# Patient Record
Sex: Male | Born: 1975 | Race: White | Hispanic: No | Smoking: Former smoker
Health system: Southern US, Community
[De-identification: ages and names within clinical notes are randomized; demographics above are authoritative.]

## PROBLEM LIST (undated history)

## (undated) DIAGNOSIS — K219 Gastro-esophageal reflux disease without esophagitis: Secondary | ICD-10-CM

---

## 2005-09-23 ENCOUNTER — Emergency Department: Payer: Self-pay | Admitting: Emergency Medicine

## 2006-08-28 ENCOUNTER — Emergency Department: Payer: Self-pay | Admitting: Emergency Medicine

## 2008-01-14 ENCOUNTER — Emergency Department: Payer: Self-pay | Admitting: Emergency Medicine

## 2008-03-31 ENCOUNTER — Emergency Department: Payer: Self-pay | Admitting: Emergency Medicine

## 2008-04-04 ENCOUNTER — Emergency Department: Payer: Self-pay | Admitting: Emergency Medicine

## 2008-09-03 ENCOUNTER — Emergency Department: Payer: Self-pay | Admitting: Emergency Medicine

## 2008-10-04 ENCOUNTER — Emergency Department: Payer: Self-pay | Admitting: Emergency Medicine

## 2009-03-07 ENCOUNTER — Emergency Department: Payer: Self-pay | Admitting: Emergency Medicine

## 2009-06-06 ENCOUNTER — Ambulatory Visit: Payer: Self-pay | Admitting: Family Medicine

## 2009-06-11 IMAGING — CR DG SHOULDER 3+V*R*
1 series · 3 of 3 positions shown · non-contrast
Comparison: none

REASON FOR EXAM: fall; pt in [place]
COMMENTS:

PROCEDURE:     DXR - DXR SHOULDER RIGHT COMPLETE  - October 04, 2008  [DATE]
RESULT:     Images of the right shoulder demonstrate no fracture,
dislocation or radiopaque foreign body.

[Series 1: view not recorded · 0.17mm/px · 3 of 3 slices shown]
[im 1/3]
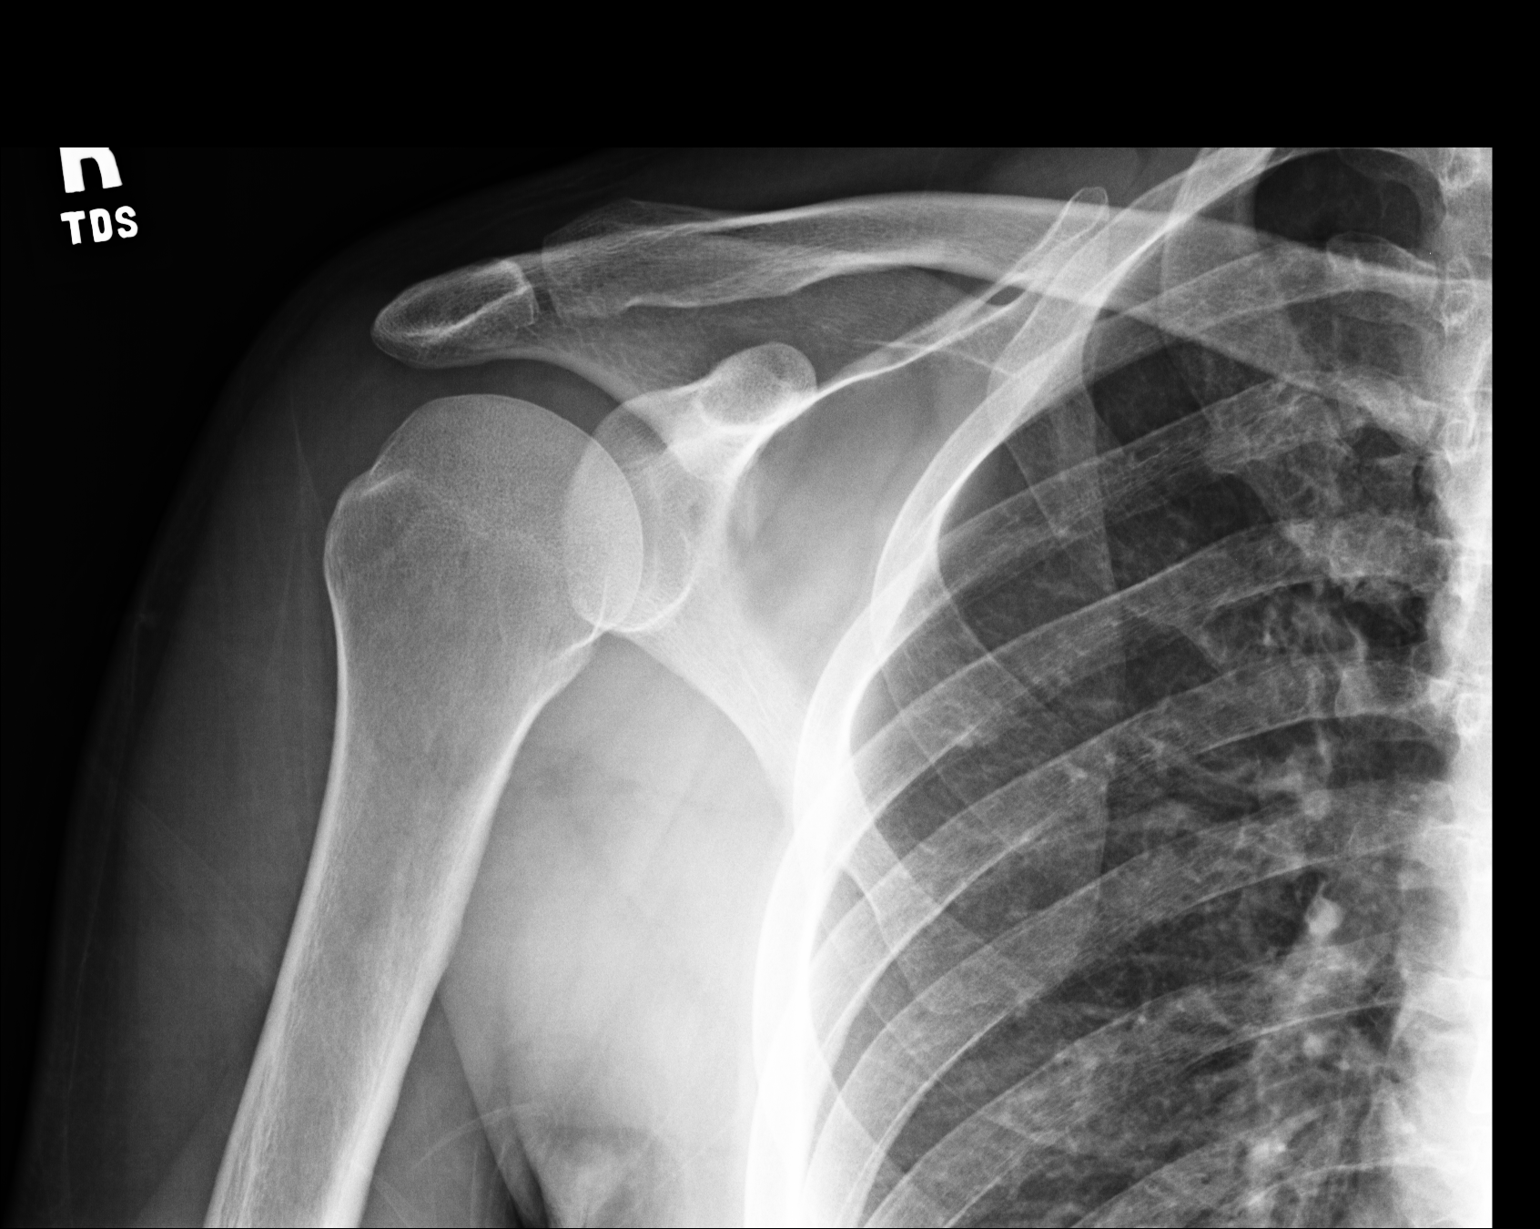
[im 2/3]
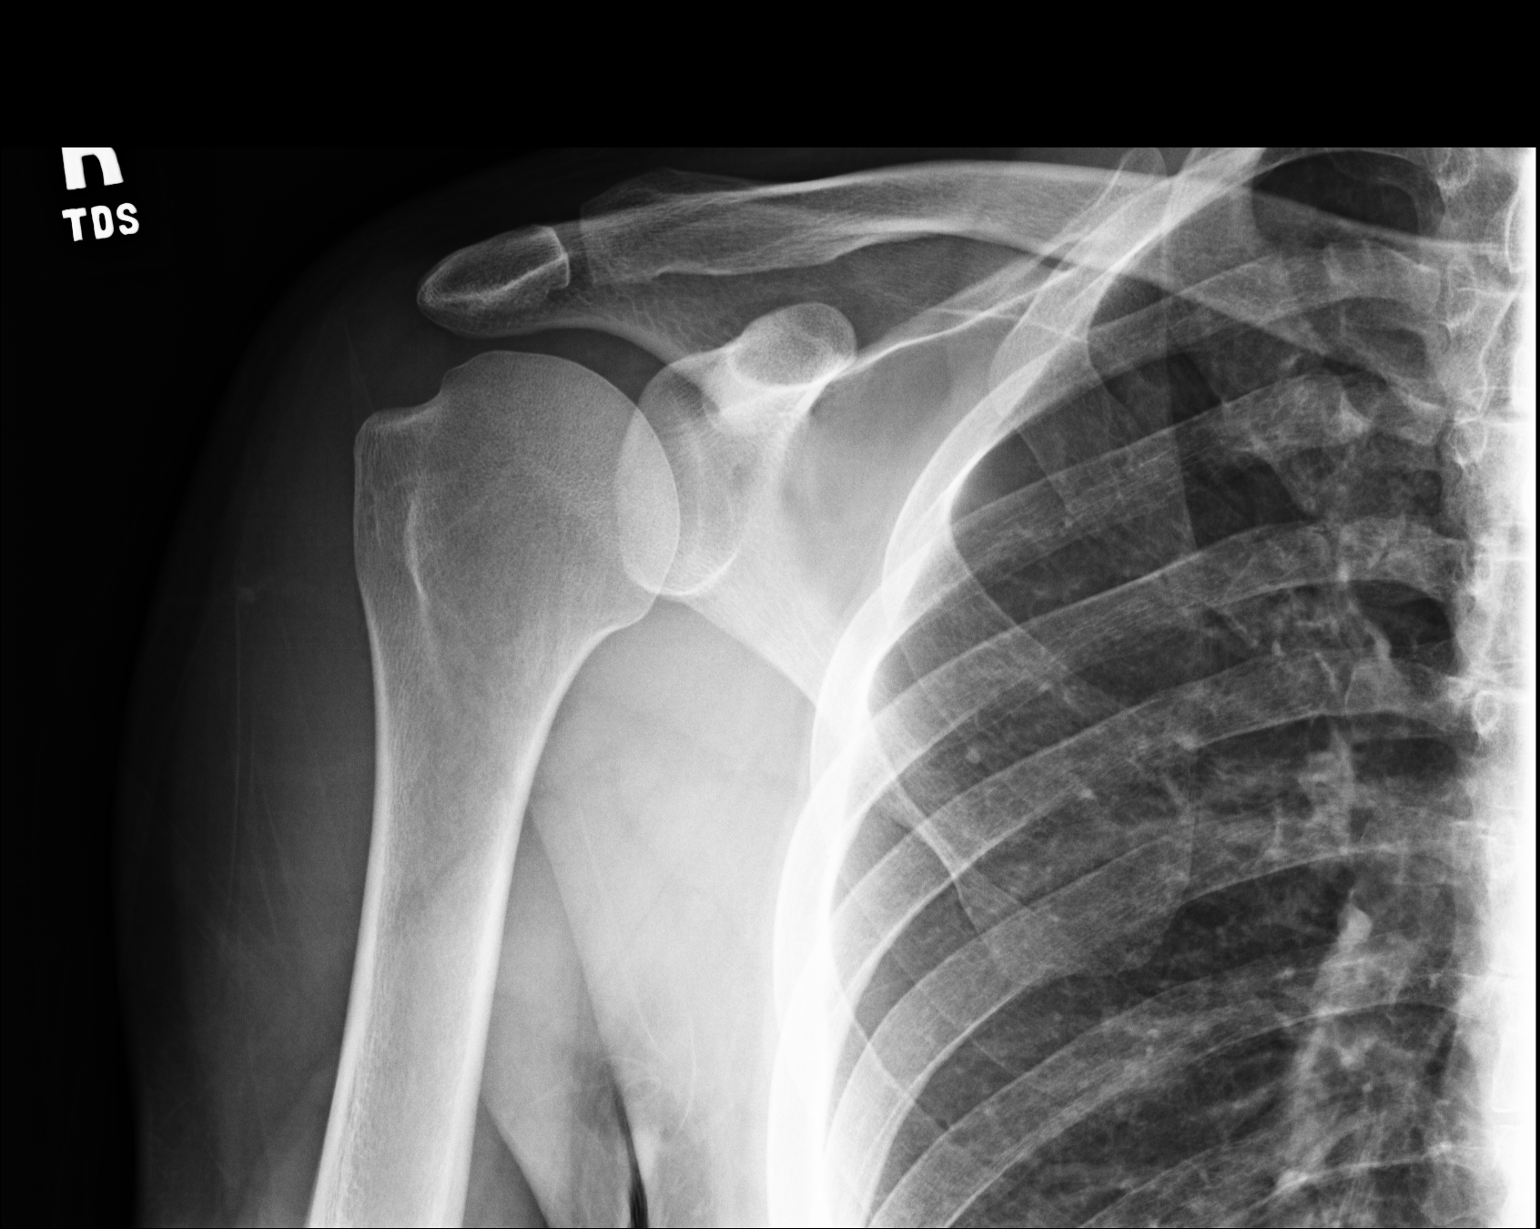
[im 3/3]
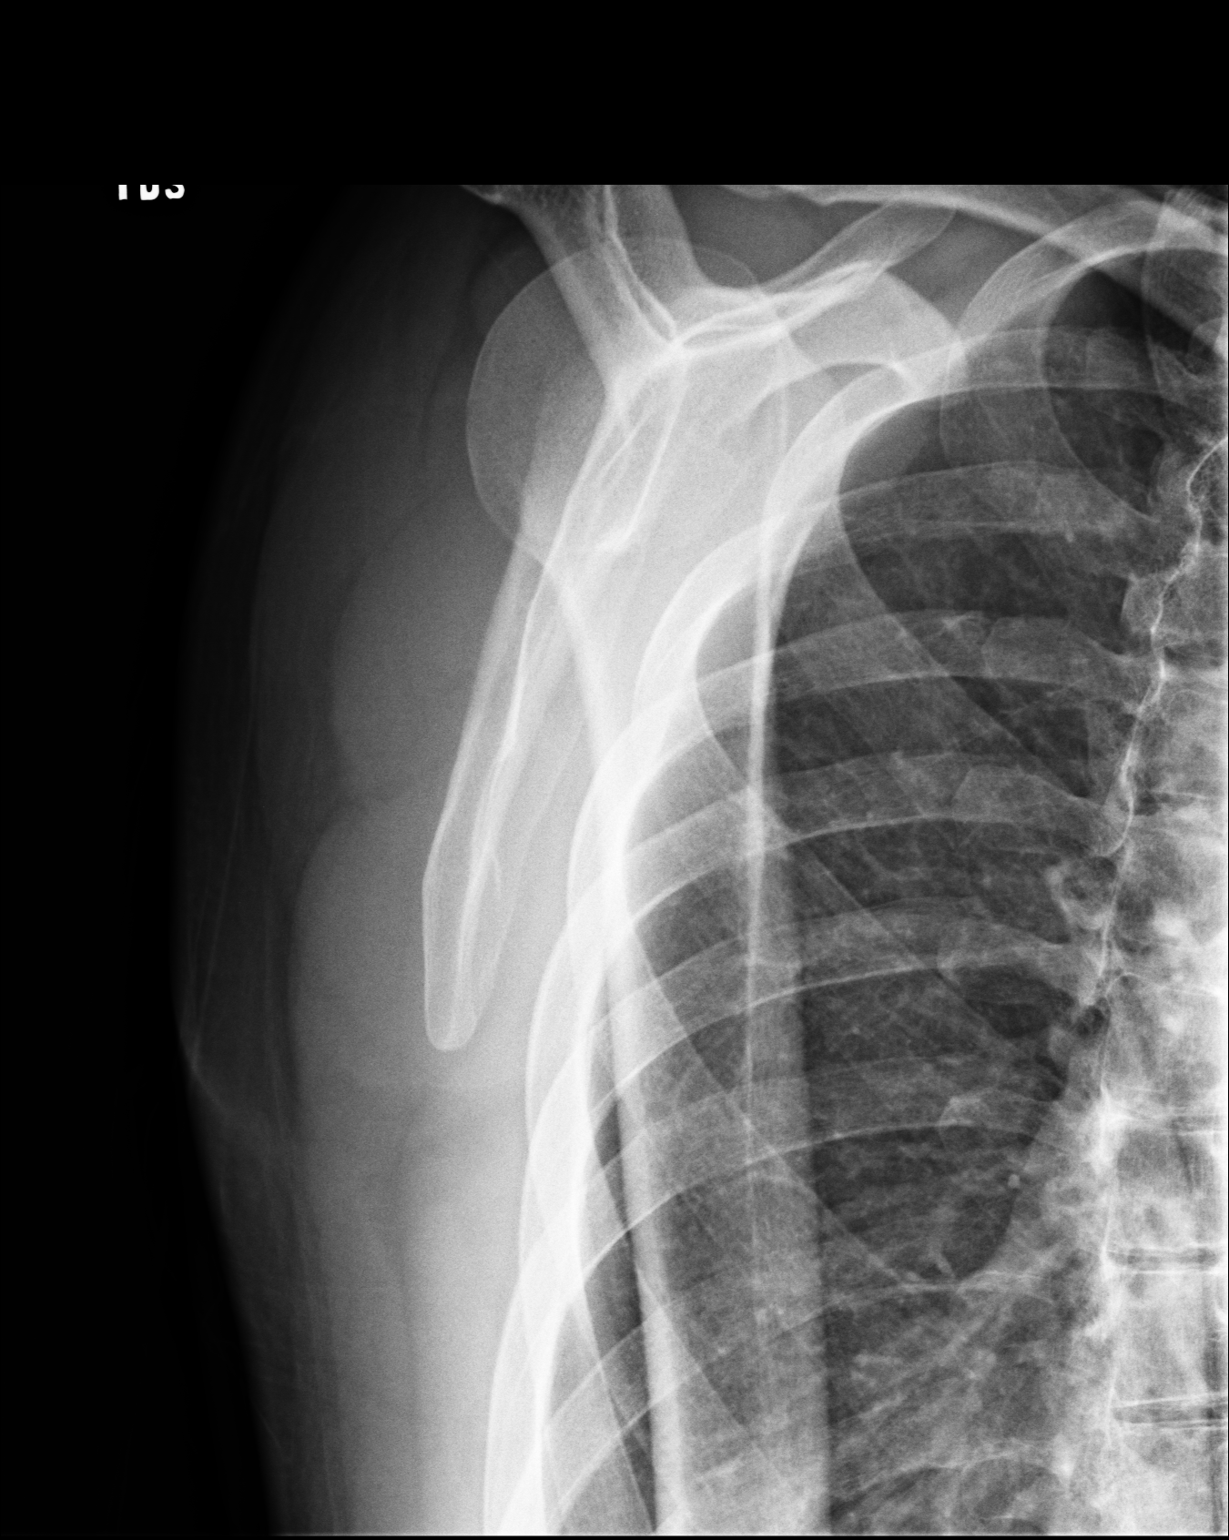

[3 of 3 positions shown; findings below may reference images not displayed]

IMPRESSION: Please see above.

## 2009-06-18 ENCOUNTER — Emergency Department: Payer: Self-pay | Admitting: Emergency Medicine

## 2009-07-27 ENCOUNTER — Ambulatory Visit: Payer: Self-pay | Admitting: Internal Medicine

## 2010-03-12 ENCOUNTER — Ambulatory Visit: Payer: Self-pay | Admitting: Internal Medicine

## 2010-07-10 ENCOUNTER — Ambulatory Visit: Payer: Self-pay | Admitting: Internal Medicine

## 2010-10-04 ENCOUNTER — Emergency Department: Payer: Self-pay | Admitting: Emergency Medicine

## 2013-11-25 ENCOUNTER — Ambulatory Visit: Payer: Self-pay | Admitting: Physician Assistant

## 2014-05-23 ENCOUNTER — Ambulatory Visit: Payer: Self-pay | Admitting: Physician Assistant

## 2014-07-28 ENCOUNTER — Ambulatory Visit: Payer: Self-pay | Admitting: Family Medicine

## 2014-07-28 LAB — URINALYSIS, COMPLETE
Bacteria: NEGATIVE
Bilirubin,UR: NEGATIVE
Blood: NEGATIVE
GLUCOSE, UR: NEGATIVE
Ketone: NEGATIVE
Leukocyte Esterase: NEGATIVE
Nitrite: NEGATIVE
PROTEIN: NEGATIVE
Ph: 7.5 (ref 5.0–8.0)
RBC, UR: NONE SEEN /HPF (ref 0–5)
SPECIFIC GRAVITY: 1.02 (ref 1.000–1.030)
Squamous Epithelial: NONE SEEN

## 2014-11-04 ENCOUNTER — Ambulatory Visit
Admit: 2014-11-04 | Disposition: A | Discharge status: Home / Self Care | Payer: Self-pay | Attending: Specialist | Admitting: Specialist

## 2015-06-28 ENCOUNTER — Ambulatory Visit: Payer: Medicaid Other

## 2015-06-28 ENCOUNTER — Ambulatory Visit
Admission: EM | Admit: 2015-06-28 | Discharge: 2015-06-28 | Disposition: A | Discharge status: Home / Self Care | Payer: Medicaid Other | Attending: Family Medicine | Admitting: Family Medicine

## 2015-06-28 ENCOUNTER — Encounter: Payer: Self-pay | Admitting: Emergency Medicine

## 2015-06-28 DIAGNOSIS — Z87891 Personal history of nicotine dependence: Secondary | ICD-10-CM | POA: Insufficient documentation

## 2015-06-28 DIAGNOSIS — K219 Gastro-esophageal reflux disease without esophagitis: Secondary | ICD-10-CM | POA: Diagnosis not present

## 2015-06-28 DIAGNOSIS — M62830 Muscle spasm of back: Secondary | ICD-10-CM | POA: Diagnosis not present

## 2015-06-28 DIAGNOSIS — Z79899 Other long term (current) drug therapy: Secondary | ICD-10-CM | POA: Insufficient documentation

## 2015-06-28 DIAGNOSIS — M549 Dorsalgia, unspecified: Secondary | ICD-10-CM | POA: Diagnosis present

## 2015-06-28 DIAGNOSIS — Z880 Allergy status to penicillin: Secondary | ICD-10-CM | POA: Insufficient documentation

## 2015-06-28 HISTORY — DX: Gastro-esophageal reflux disease without esophagitis: K21.9

## 2015-06-28 LAB — URINALYSIS COMPLETE WITH MICROSCOPIC (ARMC ONLY)
Bacteria, UA: NONE SEEN
Bilirubin Urine: NEGATIVE
Glucose, UA: NEGATIVE mg/dL
Hgb urine dipstick: NEGATIVE
Ketones, ur: NEGATIVE mg/dL
Leukocytes, UA: NEGATIVE
Nitrite: NEGATIVE
Specific Gravity, Urine: 1.025 (ref 1.005–1.030)
pH: 6.5 (ref 5.0–8.0)

## 2015-06-28 MED ORDER — DIAZEPAM 2 MG PO TABS: 2.0000 mg | ORAL_TABLET | Freq: Three times a day (TID) | ORAL | Status: DC

## 2015-06-28 MED ORDER — NAPROXEN 500 MG PO TABS: 500.0000 mg | ORAL_TABLET | Freq: Two times a day (BID) | ORAL | Status: DC

## 2015-06-28 MED ORDER — METAXALONE 800 MG PO TABS: 800.0000 mg | ORAL_TABLET | Freq: Three times a day (TID) | ORAL | Status: DC

## 2015-06-28 NOTE — ED Provider Notes (Signed)
CSN: [id number]     Arrival date & time 06/28/15  0810 History   First MD Initiated Contact with Patient 06/28/15 [phone]     Chief Complaint  Patient presents with  . Back Pain   (Consider location/radiation/quality/duration/timing/severity/associated sxs/prior Treatment) HPI   A 39 year old male who injured his back approximately 2 days ago. He states that he was walking up a hill to get on his 4 wheeler after digging a ditch and filling it with brick. The ditch was about knee level and depth. He slipped and hit his back against the rim of the ditch. He did not have immediate pain until later that night when he is awakened about 1:00 in the morning with severe low back pain. Since that time he is not able to be comfortable whenever he lies on his back or sits back on his back. Any pressure is painful. We will have some radiation into his buttock on both sides but no radiation beyond that point. He denies any numbness or tingling into his extremities.  Past Medical History  Diagnosis Date  . GERD (gastroesophageal reflux disease)    History reviewed. No pertinent past surgical history. History reviewed. No pertinent family history. Social History  Substance Use Topics  . Smoking status: Former Games developer  . Smokeless tobacco: Current User    Types: Chew  . Alcohol Use: No    Review of Systems  Constitutional: Positive for activity change. Negative for chills and fatigue.  Musculoskeletal: Positive for back pain.  All other systems reviewed and are negative.   Allergies  Penicillins  Home Medications   Prior to Admission medications   Medication Sig Start Date End Date Taking? Authorizing Provider  omeprazole (PRILOSEC) 40 MG capsule Take 40 mg by mouth 2 (two) times daily.   Yes Historical Provider, MD  diazepam (VALIUM) 2 MG tablet Take 1 tablet (2 mg total) by mouth 3 (three) times daily. 06/28/15   Lutricia Feil, PA-C  metaxalone (SKELAXIN) 800 MG tablet Take 1 tablet (800  mg total) by mouth 3 (three) times daily. 06/28/15   Lutricia Feil, PA-C  naproxen (NAPROSYN) 500 MG tablet Take 1 tablet (500 mg total) by mouth 2 (two) times daily. 06/28/15   Lutricia Feil, PA-C   Meds Ordered and Administered this Visit  Medications - No data to display  BP 125/76 mmHg  Pulse 70  Temp(Src) 97 F (36.1 C) (Tympanic)  Resp 16  Ht  (1.905 m)  Wt 270 lb (122.471 kg)  BMI 33.75 kg/m2  SpO2 99% No data found.   Physical Exam  Constitutional: He is oriented to person, place, and time. He appears well-developed and well-nourished. No distress.  HENT:  Head: Normocephalic and atraumatic.  Eyes: Pupils are equal, round, and reactive to light.  Neck: Normal range of motion. Neck supple.  Musculoskeletal: He exhibits tenderness. He exhibits no edema.  Examination lumbar spine shows the patient to maintain a slightly forward flexed positioning. Is half full range of motion of the for flexion. Is no reversal of the lordotic curve. Lateral flexion bilaterally is limited with blunting of the lumbar segments. The patient is able to toe and heel walk adequately. EHL peroneal and anterior tibialis is intact and strong to clinical stressing. Sensation is intact to light touch. DTRs are 2+ over 4 bilaterally symmetrical. Straight leg raise in the sitting position on the left is negative at 90 but on the right produces some back pain only at 90. He  has tenderness to palpation in the paraspinous muscles from L3-S1. There is no specific tenderness to palpation over the spinous processes.  Neurological: He is alert and oriented to person, place, and time. He has normal reflexes.  Skin: Skin is warm and dry. He is not diaphoretic.  Psychiatric: He has a normal mood and affect. His behavior is normal. Judgment and thought content normal.  Nursing note and vitals reviewed.   ED Course  Procedures (including critical care time)  Labs Review Labs Reviewed  URINALYSIS  COMPLETEWITH MICROSCOPIC ([facility] ONLY) - Abnormal; Notable for the following:    Protein, ur TRACE (*)    Squamous Epithelial / LPF 0-5 (*)    All other components within normal limits    Imaging Review Dg Lumbar Spine Complete  06/28/2015  CLINICAL DATA:  Pain following fall 4 days prior EXAM: LUMBAR SPINE - COMPLETE 4+ VIEW COMPARISON:  June 06, 2009 FINDINGS: Frontal, lateral, spot lumbosacral lateral, and bilateral oblique views were obtained. There are 5 non-rib-bearing lumbar type vertebral bodies. There is no fracture or spondylolisthesis. There is no appreciable disc space narrowing. There are multiple small anterior osteophytes. There is no appreciable facet arthropathy. IMPRESSION: No fracture or spondylolisthesis. No appreciable disc space narrowing. Several small anterior osteophytes are noted, a change from prior study. Electronically Signed   By: Bretta BangWilliam  Woodruff III M.D.   On: 06/28/2015 09:33     Visual Acuity Review  Right Eye Distance:   Left Eye Distance:   Bilateral Distance:    Right Eye Near:   Left Eye Near:    Bilateral Near:         MDM   1. Lumbar paraspinal muscle spasm    New Prescriptions   DIAZEPAM (VALIUM) 2 MG TABLET    Take 1 tablet (2 mg total) by mouth 3 (three) times daily.   METAXALONE (SKELAXIN) 800 MG TABLET    Take 1 tablet (800 mg total) by mouth 3 (three) times daily.   NAPROXEN (NAPROSYN) 500 MG TABLET    Take 1 tablet (500 mg total) by mouth 2 (two) times daily.  Plan: 1. Test/x-ray results and diagnosis reviewed with patient 2. rx as per orders; risks, benefits, potential side effects reviewed with patient 3. Recommend supportive treatment with rest and heat or ice as necessary. The patient is unable to read or write so all instructions were explained in detail in the presence of his wife. He has any questions told him to return to our clinic.  4. F/u prn if symptoms worsen or don't improve     Lutricia FeilWilliam P Jaquese Irving,  PA-C 06/28/15 [phone]

## 2015-06-28 NOTE — ED Notes (Signed)
Patient c/o lower back pain after falling in a ditch at his house and landing on his back 2 days ago.

## 2016-03-08 ENCOUNTER — Ambulatory Visit
Admission: EM | Admit: 2016-03-08 | Discharge: 2016-03-08 | Disposition: A | Discharge status: Home / Self Care | Payer: Medicaid Other | Attending: Family Medicine | Admitting: Family Medicine

## 2016-03-08 ENCOUNTER — Encounter: Payer: Self-pay | Admitting: Emergency Medicine

## 2016-03-08 ENCOUNTER — Ambulatory Visit: Payer: Medicaid Other

## 2016-03-08 DIAGNOSIS — S67190A Crushing injury of right index finger, initial encounter: Secondary | ICD-10-CM | POA: Insufficient documentation

## 2016-03-08 DIAGNOSIS — Z880 Allergy status to penicillin: Secondary | ICD-10-CM | POA: Diagnosis not present

## 2016-03-08 DIAGNOSIS — F17200 Nicotine dependence, unspecified, uncomplicated: Secondary | ICD-10-CM | POA: Diagnosis not present

## 2016-03-08 DIAGNOSIS — X58XXXA Exposure to other specified factors, initial encounter: Secondary | ICD-10-CM | POA: Insufficient documentation

## 2016-03-08 DIAGNOSIS — S6991XA Unspecified injury of right wrist, hand and finger(s), initial encounter: Secondary | ICD-10-CM

## 2016-03-08 DIAGNOSIS — S6000XA Contusion of unspecified finger without damage to nail, initial encounter: Secondary | ICD-10-CM

## 2016-03-08 DIAGNOSIS — K219 Gastro-esophageal reflux disease without esophagitis: Secondary | ICD-10-CM | POA: Diagnosis not present

## 2016-03-08 MED ORDER — KETOROLAC TROMETHAMINE 60 MG/2ML IM SOLN
60.0000 mg | Freq: Once | INTRAMUSCULAR | Status: AC
  Administered 2016-03-08: 60 mg via INTRAMUSCULAR

## 2016-03-08 MED ORDER — OXYCODONE-ACETAMINOPHEN 5-325 MG PO TABS: 1.0000 | ORAL_TABLET | Freq: Three times a day (TID) | ORAL | 0 refills | Status: DC | PRN

## 2016-03-08 MED ORDER — MELOXICAM 15 MG PO TABS: 15.0000 mg | ORAL_TABLET | Freq: Every day | ORAL | 0 refills | Status: DC

## 2016-03-08 NOTE — ED Provider Notes (Signed)
[facility]    CSN: [id number] Arrival date & time: 03/08/16  1225  First Provider Contact:  None       History   Chief Complaint Chief Complaint  Patient presents with  . Finger Injury    HPI Cammy BrochureJohn [place]use [place] Bucy is a 40 y.o. male.   Patient is a 40 year old white male who was working on motor vehicle part was trying to loosen the part with a hammer and one up hitting his right pointer finger at the DIP joint. Stasis 10 out 10 pain. Difficulty bending the joint moving the finger. No loss of consciousness. He does chew tobacco as broached penicillin. No pertinent family medical history that relates to this visit..   The history is provided by the patient. No language interpreter was used.  Hand Injury  Location:  Hand Hand location:  R hand Injury: yes   Time since incident:  4 hours Mechanism of injury: crush   Crush injury:    Mechanism:  Hand tool Pain details:    Quality:  Aching and shooting   Radiates to:  Does not radiate   Severity:  Severe   Onset quality:  Sudden   Progression:  Unchanged Handedness:  Right-handed Dislocation: no   Foreign body present:  No foreign bodies Relieved by:  Nothing Ineffective treatments:  None tried Risk factors: no concern for non-accidental trauma, no known bone disorder, no frequent fractures and no recent illness     Past Medical History:  Diagnosis Date  . GERD (gastroesophageal reflux disease)     There are no active problems to display for this patient.   History reviewed. No pertinent surgical history.     Home Medications    Prior to Admission medications   Medication Sig Start Date End Date Taking? Authorizing Provider  meloxicam (MOBIC) 15 MG tablet Take 1 tablet (15 mg total) by mouth daily. 03/08/16   Hassan RowanEugene Shaquila Sigman, MD  omeprazole (PRILOSEC) 40 MG capsule Take 40 mg by mouth 2 (two) times daily.    Historical Provider, MD  oxyCODONE-acetaminophen (PERCOCET/ROXICET) 5-325 MG tablet Take 1  tablet by mouth every 8 (eight) hours as needed for moderate pain or severe pain. 03/08/16   Hassan RowanEugene Jamine Wingate, MD    Family History History reviewed. No pertinent family history.  Social History Social History  Substance Use Topics  . Smoking status: Former Games developermoker  . Smokeless tobacco: Current User    Types: Chew  . Alcohol use No     Allergies   Penicillins   Review of Systems Review of Systems  All other systems reviewed and are negative.    Physical Exam Triage Vital Signs ED Triage Vitals  Enc Vitals Group     BP 03/08/16 1247 115/73     Pulse Rate 03/08/16 1247 82     Resp 03/08/16 1247 16     Temp 03/08/16 1247 97 F (36.1 C)     Temp Source 03/08/16 1247 Tympanic     SpO2 03/08/16 1247 98 %     Weight --      Height --      Head Circumference --      Peak Flow --      Pain Score 03/08/16 1250 10     Pain Loc --      Pain Edu? --      Excl. in GC? --    No data found.   Updated Vital Signs BP 115/73 (BP Location: Left Arm)  Pulse 82   Temp 97 F (36.1 C) (Tympanic)   Resp 16   SpO2 98%   Visual Acuity Right Eye Distance:   Left Eye Distance:   Bilateral Distance:    Right Eye Near:   Left Eye Near:    Bilateral Near:     Physical Exam   UC Treatments / Results  Labs (all labs ordered are listed, but only abnormal results are displayed) Labs Reviewed - No data to display  EKG  EKG Interpretation None       Radiology Dg Finger Index Right  Result Date: 03/08/2016 CLINICAL DATA:  Hit finger with hammer today EXAM: RIGHT INDEX FINGER 2+V COMPARISON:  Right finger films of 03/31/2008 FINDINGS: No acute fracture is seen. Alignment is normal. Joint spaces appear normal. A small faintly opaque foreign body is noted in the soft tissues of the palmar aspect of the right second digit along the radial aspect. IMPRESSION: No acute fracture. Tiny opaque foreign body in the soft tissues of the right second is as noted above. Electronically Signed    By: Dwyane Dee M.D.   On: 03/08/2016 13:03    Procedures Procedures (including critical care time)  Medications Ordered in UC Medications  ketorolac (TORADOL) injection 60 mg (not administered)     Initial Impression / Assessment and Plan / UC Course  I have reviewed the triage vital signs and the nursing notes.  Pertinent labs & imaging results that were available during my care of the patient were reviewed by me and considered in my medical decision making (see chart for details).  Clinical Course    Patient forcing does not have a fracture by x-ray evaluation. He has some swelling at the DIP joint and discomfort in moving the finger and bending the finger. We'll place a finger splint on the right hand place him on Mobic 15 mg 1 tablet day will give him a prescription for some Percocet to use for pain and will give him a shot of Toradol now. Instructed to follow-up PCP in about 1-2 weeks if needed. Continue using curoherapy for now.  Final Clinical Impressions(s) / UC Diagnoses   Final diagnoses:  Finger injury, right, initial encounter  Finger contusion, initial encounter    New Prescriptions New Prescriptions   MELOXICAM (MOBIC) 15 MG TABLET    Take 1 tablet (15 mg total) by mouth daily.   OXYCODONE-ACETAMINOPHEN (PERCOCET/ROXICET) 5-325 MG TABLET    Take 1 tablet by mouth every 8 (eight) hours as needed for moderate pain or severe pain.     Hassan Rowan, MD 03/08/16 [phone]

## 2016-03-08 NOTE — ED Triage Notes (Signed)
Patient states that he hit a hammer on his right 2nd finger today.

## 2016-03-08 NOTE — ED Notes (Signed)
Patient shows no signs of adverse reaction to medication at this time.  

## 2016-07-03 ENCOUNTER — Ambulatory Visit
Admission: EM | Admit: 2016-07-03 | Discharge: 2016-07-03 | Disposition: A | Discharge status: Home / Self Care | Payer: Medicaid Other | Attending: Family Medicine | Admitting: Family Medicine

## 2016-07-03 DIAGNOSIS — R51 Headache: Secondary | ICD-10-CM | POA: Insufficient documentation

## 2016-07-03 DIAGNOSIS — Z79899 Other long term (current) drug therapy: Secondary | ICD-10-CM | POA: Insufficient documentation

## 2016-07-03 DIAGNOSIS — Z87891 Personal history of nicotine dependence: Secondary | ICD-10-CM | POA: Insufficient documentation

## 2016-07-03 DIAGNOSIS — J029 Acute pharyngitis, unspecified: Secondary | ICD-10-CM | POA: Insufficient documentation

## 2016-07-03 DIAGNOSIS — Z880 Allergy status to penicillin: Secondary | ICD-10-CM | POA: Diagnosis not present

## 2016-07-03 DIAGNOSIS — K219 Gastro-esophageal reflux disease without esophagitis: Secondary | ICD-10-CM | POA: Diagnosis not present

## 2016-07-03 LAB — RAPID STREP SCREEN (MED CTR MEBANE ONLY): STREPTOCOCCUS, GROUP A SCREEN (DIRECT): NEGATIVE

## 2016-07-03 MED ORDER — AZITHROMYCIN 250 MG PO TABS: ORAL_TABLET | ORAL | 0 refills | Status: DC

## 2016-07-03 MED ORDER — ACETAMINOPHEN 325 MG PO TABS
650.0000 mg | ORAL_TABLET | Freq: Once | ORAL | Status: AC
  Administered 2016-07-03: 650 mg via ORAL

## 2016-07-03 NOTE — ED Provider Notes (Signed)
CSN: [id number]     Arrival date & time 07/03/16  0804 History   First MD Initiated Contact with Patient 07/03/16 0827     Chief Complaint  Patient presents with  . Sore Throat  . Headache   (Consider location/radiation/quality/duration/timing/severity/associated sxs/prior Treatment) HPI   40 year old male who is illiterate and swallow three-day history of sore throat and headache. He has had no coughing. He denies any fever or chills. He has a history of a penicillin allergy.       Past Medical History:  Diagnosis Date  . GERD (gastroesophageal reflux disease)    History reviewed. No pertinent surgical history. Family History  Problem Relation Age of Onset  . Diabetes Mother    Social History  Substance Use Topics  . Smoking status: Former Games developermoker  . Smokeless tobacco: Current User    Types: Chew  . Alcohol use No    Review of Systems  Constitutional: Positive for activity change. Negative for appetite change, chills, fatigue and fever.  HENT: Positive for congestion, postnasal drip, sinus pain, sinus pressure, sore throat and voice change.   Respiratory: Negative for cough and shortness of breath.   All other systems reviewed and are negative.   Allergies  Penicillins  Home Medications   Prior to Admission medications   Medication Sig Start Date End Date Taking? Authorizing Provider  omeprazole (PRILOSEC) 40 MG capsule Take 40 mg by mouth 2 (two) times daily.   Yes Historical Provider, MD  azithromycin (ZITHROMAX Z-PAK) 250 MG tablet Use as per package instructions 07/03/16   Lutricia FeilWilliam P Jlynn Ly, PA-C   Meds Ordered and Administered this Visit   Medications  acetaminophen (TYLENOL) tablet 650 mg (650 mg Oral Given 07/03/16 0841)    BP 115/76 (BP Location: Right Arm)   Pulse 80   Temp 98.5 F (36.9 C) (Oral)   Resp 18   Ht 6\' 4"  (1.93 m)   Wt 271 lb (122.9 kg)   SpO2 98%   BMI 32.99 kg/m  No data found.   Physical Exam  Constitutional: He is oriented  to person, place, and time. He appears well-developed and well-nourished.  Patient is very unkempt.  HENT:  Head: Normocephalic and atraumatic.  Right Ear: External ear normal.  Left Ear: External ear normal.  Mouth/Throat: No oropharyngeal exudate.  Patient has a  erythematous pharynx There is no exudate present. He does have tenderness to percussion over the frontal and maxillary sinuses.  Eyes: EOM are normal. Pupils are equal, round, and reactive to light. Right eye exhibits no discharge. Left eye exhibits no discharge.  Neck: Normal range of motion. Neck supple.  Pulmonary/Chest: Effort normal and breath sounds normal. No respiratory distress. He has no wheezes. He has no rales.  Musculoskeletal: Normal range of motion.  Lymphadenopathy:    He has no cervical adenopathy.  Neurological: He is alert and oriented to person, place, and time.  Skin: Skin is warm and dry.  Psychiatric: He has a normal mood and affect. His behavior is normal. Judgment and thought content normal.  Nursing note and vitals reviewed.   Urgent Care Course   Clinical Course     Procedures (including critical care time)  Labs Review Labs Reviewed  RAPID STREP SCREEN (NOT AT [facility])  CULTURE, GROUP A STREP [facility])    Imaging Review No results found. Discharge Medication List as of 07/03/2016  8:52 AM    START taking these medications   Details  azithromycin (ZITHROMAX Z-PAK) 250 MG tablet  Use as per package instructions, Normal        Visual Acuity Review  Right Eye Distance:   Left Eye Distance:   Bilateral Distance:    Right Eye Near:   Left Eye Near:    Bilateral Near:         MDM   1. Pharyngitis, unspecified etiology    Discharge Medication List as of 07/03/2016  8:52 AM    START taking these medications   Details  azithromycin (ZITHROMAX Z-PAK) 250 MG tablet Use as per package instructions, Normal       Plan: 1. Test/x-ray results and diagnosis reviewed with patient 2.  rx as per orders; risks, benefits, potential side effects reviewed with patient 3. Recommend supportive treatment with treatment with salt water gargles or lozenges. This is explained to the patient in detail. I also provided him with written material that hopefully he will have someone be able to read to him. I attempted to explain to him the virus  and that he may not improve with the antibiotics. If it is a virus it will have to run its course. I have further recommended that he consider following up with his primary care physician if he is not improving in several days. 4. F/u prn if symptoms worsen or don't improve    Lutricia FeilWilliam P Azaylah Stailey, PA-C 07/03/16 [phone]

## 2016-07-03 NOTE — ED Triage Notes (Signed)
Pt states that he is losing his voice and his throat is sore he has also had a headache and tylenol isnt helping.

## 2016-07-06 ENCOUNTER — Telehealth: Payer: Self-pay | Admitting: Gynecology

## 2016-07-06 LAB — CULTURE, GROUP A STREP (THRC)

## 2016-07-06 NOTE — Telephone Encounter (Signed)
Patient return phone call. Negative strep result was given to patient. Per patient throat still hurt and his still taking medication that was given. Advice patient to follow up if not getting better.

## 2016-10-06 ENCOUNTER — Encounter: Payer: Self-pay | Admitting: *Deleted

## 2016-10-06 ENCOUNTER — Ambulatory Visit
Admission: EM | Admit: 2016-10-06 | Discharge: 2016-10-06 | Disposition: A | Discharge status: Home / Self Care | Payer: Medicaid Other | Attending: Family Medicine | Admitting: Family Medicine

## 2016-10-06 DIAGNOSIS — S39012A Strain of muscle, fascia and tendon of lower back, initial encounter: Secondary | ICD-10-CM

## 2016-10-06 DIAGNOSIS — M5431 Sciatica, right side: Secondary | ICD-10-CM | POA: Diagnosis not present

## 2016-10-06 MED ORDER — PREDNISONE 20 MG PO TABS: 20.0000 mg | ORAL_TABLET | Freq: Every day | ORAL | 0 refills | Status: DC

## 2016-10-06 MED ORDER — HYDROCODONE-ACETAMINOPHEN 5-325 MG PO TABS: ORAL_TABLET | ORAL | 0 refills | Status: DC

## 2016-10-06 MED ORDER — CYCLOBENZAPRINE HCL 10 MG PO TABS: 10.0000 mg | ORAL_TABLET | Freq: Three times a day (TID) | ORAL | 0 refills | Status: DC | PRN

## 2016-10-06 NOTE — Discharge Instructions (Signed)
Heat to affected area °

## 2016-10-06 NOTE — ED Provider Notes (Signed)
[facility]    CSN: [id number] Arrival date & time: 10/06/16  1133     History   Chief Complaint Chief Complaint  Patient presents with  . Back Pain    HPI Donald Cooke is a 41 y.o. male.    Back Pain  Location:  Lumbar spine Quality:  Aching Radiates to:  R posterior upper leg Pain severity:  Moderate Pain is:  Same all the time Onset quality:  Sudden Duration:  3 days Timing:  Constant Progression:  Unchanged Chronicity:  New Context: not emotional stress, not falling, not jumping from heights, not lifting heavy objects, not MCA, not MVA, not occupational injury, not pedestrian accident, not physical stress, not recent illness, not recent injury and not twisting   Relieved by:  Nothing Ineffective treatments:  OTC medications Associated symptoms: no abdominal pain, no abdominal swelling, no bladder incontinence, no bowel incontinence, no chest pain, no dysuria, no fever, no headaches, no leg pain, no numbness, no paresthesias, no pelvic pain, no perianal numbness, no tingling, no weakness and no weight loss   Risk factors: lack of exercise and obesity   Risk factors: no hx of cancer, no recent surgery, no steroid use and no vascular disease     Past Medical History:  Diagnosis Date  . GERD (gastroesophageal reflux disease)     There are no active problems to display for this patient.   History reviewed. No pertinent surgical history.     Home Medications    Prior to Admission medications   Medication Sig Start Date End Date Taking? Authorizing Provider  omeprazole (PRILOSEC) 40 MG capsule Take 40 mg by mouth 2 (two) times daily.   Yes Historical Provider, MD  azithromycin (ZITHROMAX Z-PAK) 250 MG tablet Use as per package instructions 07/03/16   Lutricia FeilWilliam P Roemer, PA-C  cyclobenzaprine (FLEXERIL) 10 MG tablet Take 1 tablet (10 mg total) by mouth 3 (three) times daily as needed for muscle spasms. 10/06/16   Payton Mccallumrlando Mishaal Lansdale, MD    HYDROcodone-acetaminophen (NORCO/VICODIN) 5-325 MG tablet 1-2 tabs po bid prn severe pain 10/06/16   Payton Mccallumrlando Khristian Phillippi, MD  predniSONE (DELTASONE) 20 MG tablet Take 1 tablet (20 mg total) by mouth daily. 10/06/16   Payton Mccallumrlando Nature Kueker, MD    Family History Family History  Problem Relation Age of Onset  . Diabetes Mother     Social History Social History  Substance Use Topics  . Smoking status: Former Games developermoker  . Smokeless tobacco: Current User    Types: Chew  . Alcohol use No     Allergies   Penicillins   Review of Systems Review of Systems  Constitutional: Negative for fever and weight loss.  Cardiovascular: Negative for chest pain.  Gastrointestinal: Negative for abdominal pain and bowel incontinence.  Genitourinary: Negative for bladder incontinence, dysuria and pelvic pain.  Musculoskeletal: Positive for back pain.  Neurological: Negative for tingling, weakness, numbness, headaches and paresthesias.     Physical Exam Triage Vital Signs ED Triage Vitals  Enc Vitals Group     BP 10/06/16 1155 126/78     Pulse Rate 10/06/16 1155 80     Resp 10/06/16 1155 18     Temp 10/06/16 1155 98.2 F (36.8 C)     Temp Source 10/06/16 1155 Oral     SpO2 10/06/16 1155 98 %     Weight 10/06/16 1156 280 lb (127 kg)     Height 10/06/16 1156 6\' 3"  (1.905 m)     Head Circumference --  Peak Flow --      Pain Score 10/06/16 1158 10     Pain Loc --      Pain Edu? --      Excl. in GC? --    No data found.   Updated Vital Signs BP 126/78 (BP Location: Right Arm)   Pulse 80   Temp 98.2 F (36.8 C) (Oral)   Resp 18   Ht 6\' 3"  (1.905 m)   Wt 280 lb (127 kg)   SpO2 98%   BMI 35.00 kg/m   Visual Acuity Right Eye Distance:   Left Eye Distance:   Bilateral Distance:    Right Eye Near:   Left Eye Near:    Bilateral Near:     Physical Exam  Constitutional: He appears well-developed and well-nourished. No distress.  Neck: Normal range of motion. Neck supple. No tracheal  deviation present.  Pulmonary/Chest: Effort normal. No stridor. No respiratory distress.  Musculoskeletal:       Cervical back: He exhibits normal range of motion, no tenderness, no bony tenderness, no swelling, no edema, no deformity, no laceration, no pain, no spasm and normal pulse.       Lumbar back: He exhibits tenderness (paraspinous muscles) and spasm. He exhibits normal range of motion, no bony tenderness, no swelling, no edema, no deformity, no laceration, no pain and normal pulse.  Neurological: He is alert. He has normal reflexes. He displays normal reflexes. He exhibits normal muscle tone. Coordination normal.  Skin: No rash noted. He is not diaphoretic.  Nursing note and vitals reviewed.    UC Treatments / Results  Labs (all labs ordered are listed, but only abnormal results are displayed) Labs Reviewed - No data to display  EKG  EKG Interpretation None       Radiology No results found.  Procedures Procedures (including critical care time)  Medications Ordered in UC Medications - No data to display   Initial Impression / Assessment and Plan / UC Course  I have reviewed the triage vital signs and the nursing notes.  Pertinent labs & imaging results that were available during my care of the patient were reviewed by me and considered in my medical decision making (see chart for details).       Final Clinical Impressions(s) / UC Diagnoses   Final diagnoses:  Strain of lumbar region, initial encounter  Sciatica of right side    New Prescriptions Discharge Medication List as of 10/06/2016  1:37 PM    START taking these medications   Details  cyclobenzaprine (FLEXERIL) 10 MG tablet Take 1 tablet (10 mg total) by mouth 3 (three) times daily as needed for muscle spasms., Starting Sat 10/06/2016, Normal    HYDROcodone-acetaminophen (NORCO/VICODIN) 5-325 MG tablet 1-2 tabs po bid prn severe pain, Print    predniSONE (DELTASONE) 20 MG tablet Take 1 tablet (20  mg total) by mouth daily., Starting Sat 10/06/2016, Normal       1. diagnosis reviewed with patient 2. rx as per orders above; reviewed possible side effects, interactions, risks and benefits  3. Recommend supportive treatment with heat, stretching 4. Follow-up prn if symptoms worsen or don't improve   Payton Mccallum, MD 10/06/16 1418

## 2016-10-06 NOTE — ED Triage Notes (Signed)
Patient started having symptom of lower back pain radiating legs bilateral for 4 days.

## 2016-12-22 ENCOUNTER — Encounter: Payer: Self-pay | Admitting: Emergency Medicine

## 2016-12-22 ENCOUNTER — Ambulatory Visit
Admission: EM | Admit: 2016-12-22 | Discharge: 2016-12-22 | Disposition: A | Discharge status: Home / Self Care | Payer: Medicaid Other | Attending: Family Medicine | Admitting: Family Medicine

## 2016-12-22 DIAGNOSIS — S39012A Strain of muscle, fascia and tendon of lower back, initial encounter: Secondary | ICD-10-CM

## 2016-12-22 DIAGNOSIS — M5431 Sciatica, right side: Secondary | ICD-10-CM

## 2016-12-22 MED ORDER — PREDNISONE 20 MG PO TABS: 20.0000 mg | ORAL_TABLET | Freq: Every day | ORAL | 0 refills | Status: DC

## 2016-12-22 MED ORDER — HYDROCODONE-ACETAMINOPHEN 5-325 MG PO TABS: ORAL_TABLET | ORAL | 0 refills | Status: DC

## 2016-12-22 MED ORDER — CYCLOBENZAPRINE HCL 10 MG PO TABS: 10.0000 mg | ORAL_TABLET | Freq: Three times a day (TID) | ORAL | 0 refills | Status: DC | PRN

## 2016-12-22 NOTE — ED Provider Notes (Signed)
[facility]    CSN: [id number] Arrival date & time: 12/22/16  1532     History   Chief Complaint Chief Complaint  Patient presents with  . Back Pain    HPI Donald Cooke is a 41 y.o. male.    Back Pain  Location:  Lumbar spine Quality:  Aching Radiates to:  R posterior upper leg and R knee Pain severity:  Moderate Pain is:  Same all the time Onset quality:  Sudden Duration:  2 days Timing:  Constant Progression:  Unchanged Chronicity:  New Context: lifting heavy objects   Relieved by:  Nothing Ineffective treatments:  OTC medications Associated symptoms: no abdominal pain, no abdominal swelling, no bladder incontinence, no bowel incontinence, no chest pain, no dysuria, no fever, no headaches, no leg pain, no numbness, no paresthesias, no pelvic pain, no perianal numbness, no tingling, no weakness and no weight loss   Risk factors: no hx of cancer, no hx of osteoporosis, no lack of exercise, no menopause, not obese, not pregnant, no recent surgery, no steroid use and no vascular disease     Past Medical History:  Diagnosis Date  . GERD (gastroesophageal reflux disease)     There are no active problems to display for this patient.   History reviewed. No pertinent surgical history.     Home Medications    Prior to Admission medications   Medication Sig Start Date End Date Taking? Authorizing Provider  pantoprazole (PROTONIX) 40 MG tablet Take 40 mg by mouth 2 (two) times daily.   Yes [provider]  cyclobenzaprine (FLEXERIL) 10 MG tablet Take 1 tablet (10 mg total) by mouth 3 (three) times daily as needed for muscle spasms. 12/22/16   Payton Mccallumonty, Doss Cybulski, MD  HYDROcodone-acetaminophen (NORCO/VICODIN) 5-325 MG tablet 1 tab po qd prn 12/22/16   Payton Mccallumonty, Liese Dizdarevic, MD  predniSONE (DELTASONE) 20 MG tablet Take 1 tablet (20 mg total) by mouth daily. 12/22/16   Payton Mccallumonty, Viriginia Amendola, MD    Family History Family History  Problem Relation Age of Onset  .  Diabetes Mother     Social History Social History  Substance Use Topics  . Smoking status: Former Games developermoker  . Smokeless tobacco: Current User    Types: Chew  . Alcohol use No     Allergies   Penicillins   Review of Systems Review of Systems  Constitutional: Negative for fever and weight loss.  Cardiovascular: Negative for chest pain.  Gastrointestinal: Negative for abdominal pain and bowel incontinence.  Genitourinary: Negative for bladder incontinence, dysuria and pelvic pain.  Musculoskeletal: Positive for back pain.  Neurological: Negative for tingling, weakness, numbness, headaches and paresthesias.     Physical Exam Triage Vital Signs ED Triage Vitals  Enc Vitals Group     BP 12/22/16 1554 115/73     Pulse Rate 12/22/16 1554 74     Resp 12/22/16 1554 16     Temp 12/22/16 1554 98.8 F (37.1 C)     Temp Source 12/22/16 1554 Oral     SpO2 12/22/16 1554 98 %     Weight 12/22/16 1552 275 lb (124.7 kg)     Height 12/22/16 1552 6\' 4"  (1.93 m)     Head Circumference --      Peak Flow --      Pain Score 12/22/16 1552 8     Pain Loc --      Pain Edu? --      Excl. in GC? --    No  data found.   Updated Vital Signs BP 115/73 (BP Location: Left Arm)   Pulse 74   Temp 98.8 F (37.1 C) (Oral)   Resp 16   Ht 6\' 4"  (1.93 m)   Wt 275 lb (124.7 kg)   SpO2 98%   BMI 33.47 kg/m   Visual Acuity Right Eye Distance:   Left Eye Distance:   Bilateral Distance:    Right Eye Near:   Left Eye Near:    Bilateral Near:     Physical Exam  Constitutional: He appears well-developed and well-nourished. No distress.  Neck: Normal range of motion. Neck supple. No tracheal deviation present.  Pulmonary/Chest: Effort normal. No stridor. No respiratory distress.  Musculoskeletal:       Cervical back: Normal. He exhibits normal range of motion, no tenderness, no bony tenderness, no swelling, no edema, no deformity, no laceration, no pain, no spasm and normal pulse.        Lumbar back: He exhibits tenderness and spasm. He exhibits normal range of motion, no bony tenderness, no swelling, no edema, no deformity, no laceration, no pain and normal pulse.  Neurological: He is alert. He has normal reflexes. He displays normal reflexes. He exhibits normal muscle tone. Coordination normal.  Skin: No rash noted. He is not diaphoretic.  Nursing note and vitals reviewed.    UC Treatments / Results  Labs (all labs ordered are listed, but only abnormal results are displayed) Labs Reviewed - No data to display  EKG  EKG Interpretation None       Radiology No results found.  Procedures Procedures (including critical care time)  Medications Ordered in UC Medications - No data to display   Initial Impression / Assessment and Plan / UC Course  I have reviewed the triage vital signs and the nursing notes.  Pertinent labs & imaging results that were available during my care of the patient were reviewed by me and considered in my medical decision making (see chart for details).       Final Clinical Impressions(s) / UC Diagnoses   Final diagnoses:  Strain of lumbar region, initial encounter  Sciatica of right side    New Prescriptions Discharge Medication List as of 12/22/2016  4:27 PM    START taking these medications   Details  cyclobenzaprine (FLEXERIL) 10 MG tablet Take 1 tablet (10 mg total) by mouth 3 (three) times daily as needed for muscle spasms., Starting Sat 12/22/2016, Normal    HYDROcodone-acetaminophen (NORCO/VICODIN) 5-325 MG tablet 1 tab po qd prn, Print    predniSONE (DELTASONE) 20 MG tablet Take 1 tablet (20 mg total) by mouth daily., Starting Sat 12/22/2016, Normal       1. diagnosis reviewed with patient 2. rx as per orders above; reviewed possible side effects, interactions, risks and benefits  3. Recommend supportive treatment with heat, stretching 4. Follow-up prn if symptoms worsen or don't improve   Payton Mccallum,  MD 12/22/16 1643

## 2016-12-22 NOTE — ED Triage Notes (Signed)
Patient c/o lower back pain that started yesterday.   Patient denies injury. 

## 2018-03-12 ENCOUNTER — Encounter: Payer: Self-pay | Admitting: *Deleted

## 2018-03-12 ENCOUNTER — Emergency Department
Admission: EM | Admit: 2018-03-12 | Discharge: 2018-03-13 | Disposition: A | Discharge status: Home / Self Care | Payer: Medicaid Other | Attending: Emergency Medicine | Admitting: Emergency Medicine

## 2018-03-12 ENCOUNTER — Emergency Department: Payer: Medicaid Other

## 2018-03-12 ENCOUNTER — Other Ambulatory Visit: Payer: Self-pay

## 2018-03-12 DIAGNOSIS — L02414 Cutaneous abscess of left upper limb: Secondary | ICD-10-CM | POA: Diagnosis not present

## 2018-03-12 DIAGNOSIS — R2232 Localized swelling, mass and lump, left upper limb: Secondary | ICD-10-CM | POA: Diagnosis present

## 2018-03-12 DIAGNOSIS — L0390 Cellulitis, unspecified: Secondary | ICD-10-CM

## 2018-03-12 DIAGNOSIS — Z87891 Personal history of nicotine dependence: Secondary | ICD-10-CM | POA: Insufficient documentation

## 2018-03-12 LAB — CBC WITH DIFFERENTIAL/PLATELET
Basophils Absolute: 0 10*3/uL (ref 0–0.1)
Basophils Relative: 0 %
EOS ABS: 0.1 10*3/uL (ref 0–0.7)
Eosinophils Relative: 1 %
HEMATOCRIT: 37.8 % — AB (ref 40.0–52.0)
HEMOGLOBIN: 13.2 g/dL (ref 13.0–18.0)
LYMPHS PCT: 23 %
Lymphs Abs: 2.9 10*3/uL (ref 1.0–3.6)
MCH: 30.4 pg (ref 26.0–34.0)
MCHC: 34.9 g/dL (ref 32.0–36.0)
MCV: 87.2 fL (ref 80.0–100.0)
Monocytes Absolute: 0.8 10*3/uL (ref 0.2–1.0)
Monocytes Relative: 6 %
NEUTROS ABS: 8.6 10*3/uL — AB (ref 1.4–6.5)
NEUTROS PCT: 70 %
Platelets: 298 10*3/uL (ref 150–440)
RBC: 4.33 MIL/uL — AB (ref 4.40–5.90)
RDW: 14.2 % (ref 11.5–14.5)
WBC: 12.3 10*3/uL — AB (ref 3.8–10.6)

## 2018-03-12 LAB — COMPREHENSIVE METABOLIC PANEL
ALT: 19 U/L (ref 0–44)
ANION GAP: 9 (ref 5–15)
AST: 16 U/L (ref 15–41)
Albumin: 4.1 g/dL (ref 3.5–5.0)
Alkaline Phosphatase: 74 U/L (ref 38–126)
BUN: 9 mg/dL (ref 6–20)
CHLORIDE: 105 mmol/L (ref 98–111)
CO2: 26 mmol/L (ref 22–32)
CREATININE: 0.83 mg/dL (ref 0.61–1.24)
Calcium: 9.1 mg/dL (ref 8.9–10.3)
Glucose, Bld: 128 mg/dL — ABNORMAL HIGH (ref 70–99)
POTASSIUM: 3.8 mmol/L (ref 3.5–5.1)
SODIUM: 140 mmol/L (ref 135–145)
Total Bilirubin: 0.7 mg/dL (ref 0.3–1.2)
Total Protein: 8.5 g/dL — ABNORMAL HIGH (ref 6.5–8.1)

## 2018-03-12 MED ORDER — LIDOCAINE-EPINEPHRINE (PF) 2 %-1:200000 IJ SOLN
10.0000 mL | Freq: Once | INTRAMUSCULAR | Status: DC
  Filled 2018-03-12: qty 10

## 2018-03-12 MED ORDER — CLINDAMYCIN PHOSPHATE 900 MG/50ML IV SOLN
900.0000 mg | Freq: Once | INTRAVENOUS | Status: AC
  Administered 2018-03-12: 900 mg via INTRAVENOUS
  Filled 2018-03-12: qty 50

## 2018-03-12 MED ORDER — ACETAMINOPHEN 500 MG PO TABS
1000.0000 mg | ORAL_TABLET | Freq: Once | ORAL | Status: AC
  Administered 2018-03-12: 1000 mg via ORAL
  Filled 2018-03-12: qty 2

## 2018-03-12 MED ORDER — SODIUM CHLORIDE 0.9 % IV BOLUS
1000.0000 mL | Freq: Once | INTRAVENOUS | Status: AC
  Administered 2018-03-12: 1000 mL via INTRAVENOUS

## 2018-03-12 MED ORDER — MORPHINE SULFATE (PF) 2 MG/ML IV SOLN
2.0000 mg | Freq: Once | INTRAVENOUS | Status: AC
  Administered 2018-03-12: 2 mg via INTRAVENOUS
  Filled 2018-03-12: qty 1

## 2018-03-12 NOTE — ED Notes (Signed)
FIRST NURSE NOTE:  Pt states he was bit by insect on the left posterior arm, swelling noted to arm.

## 2018-03-12 NOTE — ED Triage Notes (Signed)
Pt has large abscess on lateral LFA. Pt states sxs started yesterday at 1330. Pt c/o increased pain, swelling, and redness.

## 2018-03-12 NOTE — ED Provider Notes (Signed)
[facility] Emergency Department Provider Note  ____________________________________________  Time seen: Approximately 8:55 PM  I have reviewed the triage vital signs and the nursing notes.   HISTORY  Chief Complaint Abscess    HPI Donald Cooke is a 42 y.o. male who presents the emergency department complaining of abscess to the left forearm.  Patient presents stating that he was bitten by something, an unknown insect yesterday.  Patient did not visualize same.  Patient reports that he had pain, redness to the area starting last night.  Patient shaved the area last night.  Today, area has been extending past shaved area has becoming more painful, more erythematous.  Patient reports that he "pinched" in the area and had some minimal bloody/purulent drainage from the site.  No history of recurrent skin lesions.  No medications for this complaint prior to arrival.  Patient denies any headache, neck pain, chest pain, shortness of breath, abdominal pain, nausea vomiting, fevers or chills.    Past Medical History:  Diagnosis Date  . GERD (gastroesophageal reflux disease)     There are no active problems to display for this patient.   History reviewed. No pertinent surgical history.  Prior to Admission medications   Medication Sig Start Date End Date Taking? Authorizing Provider  cyclobenzaprine (FLEXERIL) 10 MG tablet Take 1 tablet (10 mg total) by mouth 3 (three) times daily as needed for muscle spasms. 12/22/16   Payton Mccallumonty, [place], MD  HYDROcodone-acetaminophen (NORCO/VICODIN) 5-325 MG tablet 1 tab po qd prn 12/22/16   Payton Mccallumonty, [place], MD  pantoprazole (PROTONIX) 40 MG tablet Take 40 mg by mouth 2 (two) times daily.    [provider]  predniSONE (DELTASONE) 20 MG tablet Take 1 tablet (20 mg total) by mouth daily. 12/22/16   Payton Mccallumonty, [place], MD    Allergies Penicillins  Family History  Problem Relation Age of Onset  . Diabetes Mother     Social  History Social History   Tobacco Use  . Smoking status: Former Games developermoker  . Smokeless tobacco: Current User    Types: Chew  Substance Use Topics  . Alcohol use: No  . Drug use: No     Review of Systems  Constitutional: No fever/chills Eyes: No visual changes. No discharge ENT: No upper respiratory complaints. Cardiovascular: no chest pain. Respiratory: no cough. No SOB. Gastrointestinal: No abdominal pain.  No nausea, no vomiting.   Musculoskeletal: Negative for musculoskeletal pain. Skin: Positive for erythematous and edematous skin lesion to the left forearm Neurological: Negative for headaches, focal weakness or numbness. 10-point ROS otherwise negative.  ____________________________________________   PHYSICAL EXAM:  VITAL SIGNS: ED Triage Vitals  Enc Vitals Group     BP 03/12/18 1943 122/77     Pulse Rate 03/12/18 1943 93     Resp 03/12/18 1943 18     Temp 03/12/18 1943 100.1 F (37.8 C)     Temp Source 03/12/18 1943 Oral     SpO2 03/12/18 1943 96 %     Weight 03/12/18 1943 275 lb (124.7 kg)     Height 03/12/18 1943 6\' 4"  (1.93 m)     Head Circumference --      Peak Flow --      Pain Score 03/12/18 1959 8     Pain Loc --      Pain Edu? --      Excl. in GC? --      Constitutional: Alert and oriented. Well appearing and in no acute distress. Eyes:  Conjunctivae are normal. PERRL. EOMI. Head: Atraumatic. Neck: No stridor.    Cardiovascular: Normal rate, regular rhythm. Normal S1 and S2.  Good peripheral circulation. Respiratory: Normal respiratory effort without tachypnea or retractions. Lungs CTAB. Good air entry to the bases with no decreased or absent breath sounds. Musculoskeletal: Full range of motion to all extremities. No gross deformities appreciated. Neurologic:  Normal speech and language. No gross focal neurologic deficits are appreciated.  Skin:  Skin is warm, dry and intact. No rash noted.  Patient with significant erythema and edema noted to the  left forearm.  This measures approximately 15 cm x 10 cm.  Largest diameter runs proximal to distal.  Area is extremely firm to palpation over the center of the lesion, with fluctuance surrounding firm center.  No purulent drainage.  Area is marked with indelible marker.  Radial pulse intact distally.  Sensation intact distally. Psychiatric: Mood and affect are normal. Speech and behavior are normal. Patient exhibits appropriate insight and judgement.   ____________________________________________   LABS (all labs ordered are listed, but only abnormal results are displayed)  Labs Reviewed  COMPREHENSIVE METABOLIC PANEL - Abnormal; Notable for the following components:      Result Value   Glucose, Bld 128 (*)    Total Protein 8.5 (*)    All other components within normal limits  CBC WITH DIFFERENTIAL/PLATELET - Abnormal; Notable for the following components:   WBC 12.3 (*)    RBC 4.33 (*)    HCT 37.8 (*)    Neutro Abs 8.6 (*)    All other components within normal limits   ____________________________________________  EKG   ____________________________________________  RADIOLOGY I personally viewed and evaluated these images as part of my medical decision making, as well as reviewing the written report by the radiologist.  [place] Lt Upper Extrem Ltd Soft Tissue Non Vascular  Result Date: 03/12/2018 CLINICAL DATA:  Worsening cellulitis EXAM: ULTRASOUND left UPPER EXTREMITY LIMITED TECHNIQUE: Ultrasound examination of the upper extremity soft tissues was performed in the area of clinical concern. COMPARISON:  None FINDINGS: Targeted sonography of the region of concern was performed. This is designated as the left posterior forearm. Significant subcutaneous edema. 3 x 0.8 x 0.6 cm heterogenous collection within the superficial soft tissues that appears to be associated with a sinus tract to the skin. IMPRESSION: 3 cm heterogenous collection within the soft tissues of the posterior forearm  which appears contiguous with a sinus tract to the skin, suspect for infected fluid collection. Significant edema within the surrounding soft tissues consistent with a cellulitis. Electronically Signed   By: Jasmine PangKim  Fujinaga M.D.   On: 03/12/2018 22:59    ____________________________________________    PROCEDURES  Procedure(s) performed:    Marland Kitchen.Marland Kitchen.Incision and Drainage Date/Time: 03/13/2018 12:07 AM Performed by: Evon SlackGaines, Thomas C, PA-C Authorized by: Racheal Patchesuthriell, Jonathan D, PA-C   Consent:    Consent obtained:  Verbal   Consent given by:  Patient   Risks discussed:  Bleeding, incomplete drainage and pain Location:    Type:  Abscess   Size:  3cm Pre-procedure details:    Skin preparation:  Betadine Anesthesia (see MAR for exact dosages):    Anesthesia method:  Local infiltration   Local anesthetic:  Lidocaine 1% w/o epi Procedure type:    Complexity:  Simple Procedure details:    Needle aspiration: no     Incision types:  Single straight   Incision depth:  Subcutaneous   Scalpel blade:  11   Wound management:  Probed  and deloculated   Drainage:  Purulent and bloody   Drainage amount:  Scant   Packing materials:  1/4 in gauze Post-procedure details:    Patient tolerance of procedure:  Tolerated well, no immediate complications      Medications  lidocaine-EPINEPHrine (XYLOCAINE W/EPI) 2 %-1:200000 (PF) injection 10 mL (has no administration in time range)  lidocaine-EPINEPHrine (XYLOCAINE W/EPI) 1 %-1:100000 (with pres) injection (has no administration in time range)  sodium chloride 0.9 % bolus 1,000 mL (1,000 mLs Intravenous New Bag/Given 03/12/18 2234)  clindamycin (CLEOCIN) IVPB 900 mg (900 mg Intravenous New Bag/Given 03/12/18 2314)  acetaminophen (TYLENOL) tablet 1,000 mg (1,000 mg Oral Given 03/12/18 2314)  morphine 2 MG/ML injection 2 mg (2 mg Intravenous Given 03/12/18 2335)     ____________________________________________   INITIAL IMPRESSION / ASSESSMENT AND PLAN  / ED COURSE  Pertinent labs & imaging results that were available during my care of the patient were reviewed by me and considered in my medical decision making (see chart for details).  Review of the [place] CSRS was performed in accordance of the [facility] prior to dispensing any controlled drugs.      Patient's diagnosis is consistent with abscess to left forearm.  Patient presents the emergency department complaining of erythema, edema, pain to the left forearm that is drastically worsening.  Significant cellulitis is appreciated.  Ultrasound reveals 3 cm complex fluid collection consistent with abscess.  Incision and drainage in the emergency department revealed scant bloody and purulent drainage from site.  Patient tolerated well.  Patient will be placed on clindamycin at home with limited Norco.  Patient was given 900 mg clindamycin IV here in the emergency department..  Patient will follow-up with primary care as needed, this department 2 days for wound check.  Patient is given ED precautions to return to the ED for any worsening or new symptoms.     ____________________________________________  FINAL CLINICAL IMPRESSION(S) / ED DIAGNOSES  Final diagnoses:  Cellulitis  Abscess of forearm, left      NEW MEDICATIONS STARTED DURING THIS VISIT:  ED Discharge Orders    None          This chart was dictated using voice recognition software/Dragon. Despite best efforts to proofread, errors can occur which can change the meaning. Any change was purely unintentional.    Racheal Patches, PA-C 03/13/18 0009    Willy Eddy, MD 03/13/18 [id number]

## 2018-03-13 MED ORDER — CLINDAMYCIN HCL 300 MG PO CAPS: 300.0000 mg | ORAL_CAPSULE | Freq: Four times a day (QID) | ORAL | 0 refills | Status: AC

## 2018-03-13 MED ORDER — LIDOCAINE-EPINEPHRINE 1 %-1:100000 IJ SOLN
INTRAMUSCULAR | Status: AC
  Filled 2018-03-13: qty 1

## 2018-03-13 MED ORDER — HYDROCODONE-ACETAMINOPHEN 5-325 MG PO TABS: 1.0000 | ORAL_TABLET | Freq: Four times a day (QID) | ORAL | 0 refills | Status: DC | PRN

## 2018-03-13 NOTE — ED Provider Notes (Signed)
[facility] EMERGENCY DEPARTMENT Provider Note   CSN: [id number] Arrival date & time: 03/12/18  [id number]     History   Chief Complaint Chief Complaint  Patient presents with  . Abscess    HPI Donald Cooke is a 42 y.o. male.  Presents to the emergency department for evaluation of left forearm abscess.  Abscess began yesterday.  He thinks he may have a bit by something.  Denies any trauma or injury.  Noticed a small bump yesterday with increasing redness and swelling.  Patient shaved area of erythema and noted today that area of erythema had increased in size with more swelling.  He has noted mild low-grade fevers and chills.  He denies any elbow pain, wrist pain, shoulder pain.  Pain is moderate.  Is not any medications for pain or for fevers.  HPI  Past Medical History:  Diagnosis Date  . GERD (gastroesophageal reflux disease)     There are no active problems to display for this patient.   History reviewed. No pertinent surgical history.      Home Medications    Prior to Admission medications   Medication Sig Start Date End Date Taking? Authorizing Provider  clindamycin (CLEOCIN) 300 MG capsule Take 1 capsule (300 mg total) by mouth 4 (four) times daily for 10 days. 03/13/18 03/23/18  Evon SlackGaines, Thomas C, PA-C  cyclobenzaprine (FLEXERIL) 10 MG tablet Take 1 tablet (10 mg total) by mouth 3 (three) times daily as needed for muscle spasms. 12/22/16   Payton Mccallumonty, [place], MD  HYDROcodone-acetaminophen (NORCO) 5-325 MG tablet Take 1 tablet by mouth every 6 (six) hours as needed for moderate pain. 03/13/18   Evon SlackGaines, Thomas C, PA-C  pantoprazole (PROTONIX) 40 MG tablet Take 40 mg by mouth 2 (two) times daily.    [provider]  predniSONE (DELTASONE) 20 MG tablet Take 1 tablet (20 mg total) by mouth daily. 12/22/16   Payton Mccallumonty, [place], MD    Family History Family History  Problem Relation Age of Onset  . Diabetes Mother     Social History Social History    Tobacco Use  . Smoking status: Former Games developermoker  . Smokeless tobacco: Current User    Types: Chew  Substance Use Topics  . Alcohol use: No  . Drug use: No     Allergies   Penicillins   Review of Systems Review of Systems  Constitutional: Positive for fever. Negative for chills.  Musculoskeletal: Positive for arthralgias and myalgias. Negative for joint swelling and neck pain.  Skin: Positive for rash and wound.  Neurological: Negative for numbness.     Physical Exam Updated Vital Signs BP 114/71 (BP Location: Right Arm)   Pulse 83   Temp 100.1 F (37.8 C) (Oral)   Resp 19   Ht 6\' 4"  (1.93 m)   Wt 124.7 kg   SpO2 98%   BMI 33.47 kg/m   Physical Exam  Constitutional: He is oriented to person, place, and time. He appears well-developed and well-nourished.  HENT:  Head: Normocephalic and atraumatic.  Eyes: Conjunctivae are normal.  Neck: Normal range of motion.  Cardiovascular: Normal rate.  Pulmonary/Chest: Effort normal. No respiratory distress.  Musculoskeletal: Normal range of motion.  Normal range of motion of the left elbow wrist and digits neurovascular intact.  Indurated area of erythema along the mid dorsal forearm.  No significant fluctuance.  Compartments are soft.  Neurological: He is alert and oriented to person, place, and time.  Skin: Skin is warm. No  rash noted.  Psychiatric: He has a normal mood and affect. His behavior is normal. Thought content normal.     ED Treatments / Results  Labs (all labs ordered are listed, but only abnormal results are displayed) Labs Reviewed  COMPREHENSIVE METABOLIC PANEL - Abnormal; Notable for the following components:      Result Value   Glucose, Bld 128 (*)    Total Protein 8.5 (*)    All other components within normal limits  CBC WITH DIFFERENTIAL/PLATELET - Abnormal; Notable for the following components:   WBC 12.3 (*)    RBC 4.33 (*)    HCT 37.8 (*)    Neutro Abs 8.6 (*)    All other components within  normal limits    EKG None  Radiology [place] Lt Upper Extrem Ltd Soft Tissue Non Vascular  Result Date: 03/12/2018 CLINICAL DATA:  Worsening cellulitis EXAM: ULTRASOUND left UPPER EXTREMITY LIMITED TECHNIQUE: Ultrasound examination of the upper extremity soft tissues was performed in the area of clinical concern. COMPARISON:  None FINDINGS: Targeted sonography of the region of concern was performed. This is designated as the left posterior forearm. Significant subcutaneous edema. 3 x 0.8 x 0.6 cm heterogenous collection within the superficial soft tissues that appears to be associated with a sinus tract to the skin. IMPRESSION: 3 cm heterogenous collection within the soft tissues of the posterior forearm which appears contiguous with a sinus tract to the skin, suspect for infected fluid collection. Significant edema within the surrounding soft tissues consistent with a cellulitis. Electronically Signed   By: Jasmine PangKim  Fujinaga M.D.   On: 03/12/2018 22:59    Procedures .Marland Kitchen.Incision and Drainage Date/Time: 03/13/2018 12:12 AM Performed by: Evon SlackGaines, Thomas C, PA-C Authorized by: Evon SlackGaines, Thomas C, PA-C   Consent:    Consent obtained:  Verbal   Consent given by:  Patient   Risks discussed:  Pain   Alternatives discussed:  No treatment, delayed treatment and observation Location:    Type:  Fluid collection   Size:  3x3 cm   Location:  Upper extremity   Upper extremity location:  Arm   Arm location:  L lower arm Pre-procedure details:    Skin preparation:  Betadine Anesthesia (see MAR for exact dosages):    Anesthesia method:  Local infiltration   Local anesthetic:  Lidocaine 1% WITH epi Procedure type:    Complexity:  Simple Procedure details:    Incision types:  Stab incision   Incision depth:  Dermal   Scalpel blade:  11   Wound management:  Probed and deloculated   Drainage:  Bloody and purulent   Drainage amount:  Scant   Wound treatment:  Wound left open   Packing materials:  1/4 in  gauze Post-procedure details:    Patient tolerance of procedure:  Tolerated well, no immediate complications   (including critical care time)  Medications Ordered in ED Medications  lidocaine-EPINEPHrine (XYLOCAINE W/EPI) 2 %-1:200000 (PF) injection 10 mL (has no administration in time range)  lidocaine-EPINEPHrine (XYLOCAINE W/EPI) 1 %-1:100000 (with pres) injection (has no administration in time range)  sodium chloride 0.9 % bolus 1,000 mL (1,000 mLs Intravenous New Bag/Given 03/12/18 2234)  clindamycin (CLEOCIN) IVPB 900 mg (900 mg Intravenous New Bag/Given 03/12/18 2314)  acetaminophen (TYLENOL) tablet 1,000 mg (1,000 mg Oral Given 03/12/18 2314)  morphine 2 MG/ML injection 2 mg (2 mg Intravenous Given 03/12/18 2335)     Initial Impression / Assessment and Plan / ED Course  I have reviewed the triage vital signs  and the nursing notes.  Pertinent labs & imaging results that were available during my care of the patient were reviewed by me and considered in my medical decision making (see chart for details).    42 year old male with abscess with cellulitis to left forearm.  Patient running a low-grade fever.  Tylenol was given.  CBC showed slight elevation in white count.  Discussed treatment options such as admission with IV antibiotics, incision and drainage.  Patient refused any type of admission due to his wife having surgery tomorrow morning.  Patient agreeable to round of IV antibiotics tonight along with incision and drainage with packing of iodoform with discharge with p.o. antibiotics.  Patient agreeable to return for any increasing pain, swelling, fevers above 101.  He will also return in 2 days for wound check and packing removal.  Final Clinical Impressions(s) / ED Diagnoses   Final diagnoses:  Cellulitis  Abscess of forearm, left    ED Discharge Orders         Ordered    clindamycin (CLEOCIN) 300 MG capsule  4 times daily     03/13/18 0009    HYDROcodone-acetaminophen  (NORCO) 5-325 MG tablet  Every 6 hours PRN     03/13/18 0009           Evon Slack, PA-C 03/13/18 Simeon Craft, MD 03/13/18 1921

## 2018-03-13 NOTE — Discharge Instructions (Addendum)
Please take antibiotics as prescribed.  You may use Tylenol and ibuprofen for mild to moderate pain.  For severe pain take Norco as needed.  Please return to the emergency department or follow-up with your primary care provider in 2 days for wound check and packing removal of the left forearm abscess.  If any fevers above 101, increasing redness, pain, swelling, worsening symptoms or urgent changes in your health please return to the emergency department sooner.

## 2018-05-28 ENCOUNTER — Emergency Department: Payer: Medicaid Other

## 2018-05-28 ENCOUNTER — Other Ambulatory Visit: Payer: Self-pay

## 2018-05-28 ENCOUNTER — Encounter: Payer: Self-pay | Admitting: Emergency Medicine

## 2018-05-28 ENCOUNTER — Emergency Department
Admission: EM | Admit: 2018-05-28 | Discharge: 2018-05-28 | Disposition: A | Discharge status: Home / Self Care | Payer: Medicaid Other | Attending: Emergency Medicine | Admitting: Emergency Medicine

## 2018-05-28 DIAGNOSIS — Z87891 Personal history of nicotine dependence: Secondary | ICD-10-CM | POA: Insufficient documentation

## 2018-05-28 DIAGNOSIS — W208XXA Other cause of strike by thrown, projected or falling object, initial encounter: Secondary | ICD-10-CM | POA: Diagnosis not present

## 2018-05-28 DIAGNOSIS — S99921A Unspecified injury of right foot, initial encounter: Secondary | ICD-10-CM | POA: Diagnosis present

## 2018-05-28 DIAGNOSIS — S9031XA Contusion of right foot, initial encounter: Secondary | ICD-10-CM | POA: Insufficient documentation

## 2018-05-28 DIAGNOSIS — Y9289 Other specified places as the place of occurrence of the external cause: Secondary | ICD-10-CM | POA: Insufficient documentation

## 2018-05-28 DIAGNOSIS — Y9389 Activity, other specified: Secondary | ICD-10-CM | POA: Insufficient documentation

## 2018-05-28 DIAGNOSIS — Z79899 Other long term (current) drug therapy: Secondary | ICD-10-CM | POA: Insufficient documentation

## 2018-05-28 DIAGNOSIS — Y999 Unspecified external cause status: Secondary | ICD-10-CM | POA: Diagnosis not present

## 2018-05-28 MED ORDER — TRAMADOL HCL 50 MG PO TABS: 50.0000 mg | ORAL_TABLET | Freq: Three times a day (TID) | ORAL | 0 refills | Status: AC | PRN

## 2018-05-28 NOTE — ED Triage Notes (Signed)
Pt dropped ramp to moving van on right foot.  Ambulatory to triage with limp. No deformity noted. Good cap refill.

## 2018-05-28 NOTE — ED Provider Notes (Signed)
[facility] Emergency Department Provider Note  ____________________________________________  Time seen: Approximately 11:26 PM  I have reviewed the triage vital signs and the nursing notes.   HISTORY  Chief Complaint Foot Injury    HPI Donald Cooke is a 42 y.o. male presents to the emergency department with acute right foot pain after patient reports that he was moving a metal ramp and ramp fell on foot.  Patient reports pain along the dorsum of the right foot.  Patient had some tingling along the plantar aspect of the right foot after injury occurred.  He has been able to ambulate without difficulty.  He he currently rates his pain at 7 out of 10 in intensity.  No alleviating measures were attempted prior to presenting to the emergency department.   Past Medical History:  Diagnosis Date  . GERD (gastroesophageal reflux disease)     There are no active problems to display for this patient.   History reviewed. No pertinent surgical history.  Prior to Admission medications   Medication Sig Start Date End Date Taking? Authorizing Provider  cyclobenzaprine (FLEXERIL) 10 MG tablet Take 1 tablet (10 mg total) by mouth 3 (three) times daily as needed for muscle spasms. 12/22/16   Payton Mccallum, MD  HYDROcodone-acetaminophen (NORCO) 5-325 MG tablet Take 1 tablet by mouth every 6 (six) hours as needed for moderate pain. 03/13/18   Evon Slack, PA-C  pantoprazole (PROTONIX) 40 MG tablet Take 40 mg by mouth 2 (two) times daily.    [provider]  predniSONE (DELTASONE) 20 MG tablet Take 1 tablet (20 mg total) by mouth daily. 12/22/16   Payton Mccallum, MD  traMADol (ULTRAM) 50 MG tablet Take 1 tablet (50 mg total) by mouth 3 (three) times daily as needed for up to 3 days. 05/28/18 05/31/18  Orvil Feil, PA-C    Allergies Penicillins  Family History  Problem Relation Age of Onset  . Diabetes Mother     Social History Social History    Tobacco Use  . Smoking status: Former Games developer  . Smokeless tobacco: Current User    Types: Chew  Substance Use Topics  . Alcohol use: No  . Drug use: No     Review of Systems  Constitutional: No fever/chills Eyes: No visual changes. No discharge ENT: No upper respiratory complaints. Cardiovascular: no chest pain. Respiratory: no cough. No SOB. Gastrointestinal: No abdominal pain.  No nausea, no vomiting.  No diarrhea.  No constipation. Genitourinary: Negative for dysuria. No hematuria Musculoskeletal: Patient has right foot pain. Skin: Negative for rash, abrasions, lacerations, ecchymosis. Neurological: Negative for headaches, focal weakness or numbness.   ____________________________________________   PHYSICAL EXAM:  VITAL SIGNS: ED Triage Vitals  Enc Vitals Group     BP 05/28/18 2212 135/69     Pulse Rate 05/28/18 2212 95     Resp 05/28/18 2212 18     Temp 05/28/18 2211 99.1 F (37.3 C)     Temp Source 05/28/18 2211 Oral     SpO2 05/28/18 2212 98 %     Weight 05/28/18 2212 295 lb (133.8 kg)     Height 05/28/18 2212 6\' 4"  (1.93 m)     Head Circumference --      Peak Flow --      Pain Score 05/28/18 2212 10     Pain Loc --      Pain Edu? --      Excl. in GC? --  Constitutional: Alert and oriented. Well appearing and in no acute distress. Eyes: Conjunctivae are normal. PERRL. EOMI. Head: Atraumatic. Cardiovascular: Normal rate, regular rhythm. Normal S1 and S2.  Good peripheral circulation. Respiratory: Normal respiratory effort without tachypnea or retractions. Lungs CTAB. Good air entry to the bases with no decreased or absent breath sounds. Musculoskeletal: Patient performs full range of motion at the right ankle.  He has tenderness with palpation over metatarsals 1 through 4.  No pain with palpation over the anterior or posterior talofibular ligaments, right.  No pain over the deltoid ligament, right.  No pain over the course of the right fibula.   Palpable dorsalis pedis pulse, right. Neurologic:  Normal speech and language. No gross focal neurologic deficits are appreciated.  Skin:  Skin is warm, dry and intact. No rash noted. Psychiatric: Mood and affect are normal. Speech and behavior are normal. Patient exhibits appropriate insight and judgement.   ____________________________________________   LABS (all labs ordered are listed, but only abnormal results are displayed)  Labs Reviewed - No data to display ____________________________________________  EKG   ____________________________________________  RADIOLOGY I personally viewed and evaluated these images as part of my medical decision making, as well as reviewing the written report by the radiologist.  Dg Foot Complete Right  Result Date: 05/28/2018 CLINICAL DATA:  Blunt trauma to right foot today. EXAM: RIGHT FOOT COMPLETE - 3+ VIEW COMPARISON:  None. FINDINGS: There is no evidence of fracture or dislocation. There is no evidence of arthropathy or other focal bone abnormality. Soft tissues are unremarkable. IMPRESSION: Negative. Electronically Signed   By: Elberta Fortis M.D.   On: 05/28/2018 22:41    ____________________________________________    PROCEDURES  Procedure(s) performed:    Procedures    Medications - No data to display   ____________________________________________   INITIAL IMPRESSION / ASSESSMENT AND PLAN / ED COURSE  Pertinent labs & imaging results that were available during my care of the patient were reviewed by me and considered in my medical decision making (see chart for details).  Review of the [place] CSRS was performed in accordance of the [facility] prior to dispensing any controlled drugs.      Assessment and plan Right foot contusion Patient presents to the emergency department with right foot pain after dropping a metal ramp on a earlier tonight.  X-ray examination reveals no acute bony abnormality.  Patient was able to ambulate  after incident occurred.  Patient declined crutches in the emergency department.  He was discharged with a short course of tramadol after patient reports that he has history of GI bleeds.  Patient was advised to follow-up with podiatry if conservative measures fail.  All patient questions were answered.     ____________________________________________  FINAL CLINICAL IMPRESSION(S) / ED DIAGNOSES  Final diagnoses:  Contusion of right foot, initial encounter      NEW MEDICATIONS STARTED DURING THIS VISIT:  ED Discharge Orders         Ordered    traMADol (ULTRAM) 50 MG tablet  3 times daily PRN     05/28/18 2313              This chart was dictated using voice recognition software/Dragon. Despite best efforts to proofread, errors can occur which can change the meaning. Any change was purely unintentional.    Orvil Feil, PA-C 05/28/18 2330    Phineas Semen, MD 05/28/18 [phone]

## 2018-06-20 ENCOUNTER — Ambulatory Visit: Payer: Medicaid Other

## 2018-06-20 ENCOUNTER — Other Ambulatory Visit: Payer: Self-pay

## 2018-06-20 ENCOUNTER — Encounter: Payer: Self-pay | Admitting: Emergency Medicine

## 2018-06-20 ENCOUNTER — Ambulatory Visit
Admission: EM | Admit: 2018-06-20 | Discharge: 2018-06-20 | Disposition: A | Discharge status: Home / Self Care | Payer: Medicaid Other | Attending: Family Medicine | Admitting: Family Medicine

## 2018-06-20 DIAGNOSIS — L03012 Cellulitis of left finger: Secondary | ICD-10-CM | POA: Insufficient documentation

## 2018-06-20 DIAGNOSIS — M79645 Pain in left finger(s): Secondary | ICD-10-CM | POA: Diagnosis present

## 2018-06-20 MED ORDER — SULFAMETHOXAZOLE-TRIMETHOPRIM 800-160 MG PO TABS: 1.0000 | ORAL_TABLET | Freq: Two times a day (BID) | ORAL | 0 refills | Status: DC

## 2018-06-20 MED ORDER — HYDROCODONE-ACETAMINOPHEN 5-325 MG PO TABS: ORAL_TABLET | ORAL | 0 refills | Status: DC

## 2018-06-20 NOTE — ED Provider Notes (Signed)
[facility]    CSN: [id number] Arrival date & time: 06/20/18  1645     History   Chief Complaint Chief Complaint  Patient presents with  . Finger Pain    HPI Donald Cooke is a 42 y.o. male.   42 yo male with a c/o left thumb pain, swelling and redness progressively worsening for the past 2-3 days. States a piece of metal cut his thumb slightly about 4 days ago. States he's unsure if there is a piece of metal stuck in his thumb.  Denies any fevers, chills, drainage, numbness. States he's up to date on his tetanus vaccine; states he had it 3 years ago.   The history is provided by the patient.    Past Medical History:  Diagnosis Date  . GERD (gastroesophageal reflux disease)     There are no active problems to display for this patient.   History reviewed. No pertinent surgical history.     Home Medications    Prior to Admission medications   Medication Sig Start Date End Date Taking? Authorizing Provider  pantoprazole (PROTONIX) 40 MG tablet Take 40 mg by mouth 2 (two) times daily.   Yes [provider]  cyclobenzaprine (FLEXERIL) 10 MG tablet Take 1 tablet (10 mg total) by mouth 3 (three) times daily as needed for muscle spasms. 12/22/16   Payton Mccallum, MD  HYDROcodone-acetaminophen [facility]) 5-325 MG tablet 1-2 tabs po qd prn 06/20/18   Payton Mccallum, MD  predniSONE (DELTASONE) 20 MG tablet Take 1 tablet (20 mg total) by mouth daily. 12/22/16   Payton Mccallum, MD  sulfamethoxazole-trimethoprim (BACTRIM DS,SEPTRA DS) 800-160 MG tablet Take 1 tablet by mouth 2 (two) times daily. 06/20/18   Payton Mccallum, MD    Family History Family History  Problem Relation Age of Onset  . Diabetes Mother     Social History Social History   Tobacco Use  . Smoking status: Former Games developer  . Smokeless tobacco: Current User    Types: Chew  Substance Use Topics  . Alcohol use: No  . Drug use: No     Allergies   Penicillins   Review of  Systems Review of Systems   Physical Exam Triage Vital Signs ED Triage Vitals  Enc Vitals Group     BP 06/20/18 1659 129/74     Pulse Rate 06/20/18 1659 88     Resp 06/20/18 1659 16     Temp 06/20/18 1659 98.5 F (36.9 C)     Temp Source 06/20/18 1659 Oral     SpO2 06/20/18 1659 98 %     Weight 06/20/18 1655 285 lb (129.3 kg)     Height 06/20/18 1655 6\' 4"  (1.93 m)     Head Circumference --      Peak Flow --      Pain Score 06/20/18 1655 8     Pain Loc --      Pain Edu? --      Excl. in GC? --    No data found.  Updated Vital Signs BP 129/74 (BP Location: Left Arm)   Pulse 88   Temp 98.5 F (36.9 C) (Oral)   Resp 16   Ht 6\' 4"  (1.93 m)   Wt 129.3 kg   SpO2 98%   BMI 34.69 kg/m   Visual Acuity Right Eye Distance:   Left Eye Distance:   Bilateral Distance:    Right Eye Near:   Left Eye Near:    Bilateral Near:  Physical Exam  Constitutional: He appears well-developed and well-nourished. No distress.  Musculoskeletal:       Left hand: He exhibits tenderness and swelling (over ventral aspect of thumb with blanchable skin erythema, warmth and tenderness). He exhibits normal range of motion, normal two-point discrimination, normal capillary refill, no deformity and no laceration. Normal sensation noted. Normal strength noted.  Skin: He is not diaphoretic.  Nursing note and vitals reviewed.    UC Treatments / Results  Labs (all labs ordered are listed, but only abnormal results are displayed) Labs Reviewed - No data to display  EKG None  Radiology Dg Finger Thumb Left  Result Date: 06/20/2018 CLINICAL DATA:  Pain and swelling in the left thumb.  Laceration. EXAM: LEFT THUMB 2+V COMPARISON:  None. FINDINGS: No opaque foreign body. No soft tissue gas, fracture, or dislocation. Possible soft tissue swelling IMPRESSION: No fracture or opaque foreign body. Electronically Signed   By: Marnee SpringJonathon  Watts M.D.   On: 06/20/2018 17:35    Procedures Procedures  (including critical care time)  Medications Ordered in UC Medications - No data to display  Initial Impression / Assessment and Plan / UC Course  I have reviewed the triage vital signs and the nursing notes.  Pertinent labs & imaging results that were available during my care of the patient were reviewed by me and considered in my medical decision making (see chart for details).      Final Clinical Impressions(s) / UC Diagnoses   Final diagnoses:  Cellulitis of left thumb     Discharge Instructions     Warm wet compresses to thumb, elevate    ED Prescriptions    Medication Sig Dispense Auth. Provider   sulfamethoxazole-trimethoprim (BACTRIM DS,SEPTRA DS) 800-160 MG tablet Take 1 tablet by mouth 2 (two) times daily. 20 tablet Payton Mccallumonty, Bernetha Anschutz, MD   HYDROcodone-acetaminophen (NORCO) 5-325 MG tablet 1-2 tabs po qd prn 4 tablet Payton Mccallumonty, Juley Giovanetti, MD      1. x-ray results and diagnosis reviewed with patient 2. rx as per orders above; reviewed possible side effects, interactions, risks and benefits  3. Recommend supportive treatment as above 4. Go to ED if symptoms worsen or are not improving 5. Follow-up prn  Controlled Substance Prescriptions [place] Controlled Substance Registry consulted? Not Applicable   Payton Mccallumonty, Denzil Mceachron, MD 06/20/18 1758

## 2018-06-20 NOTE — Discharge Instructions (Signed)
Warm wet compresses to thumb, elevate

## 2018-06-20 NOTE — ED Notes (Signed)
Patient states that he got a tetanus shot 3 years ago.  I could not find a record of any tetanus shots.  Patient was informed of this Patient has refused to get a tetanus shot today.

## 2018-06-20 NOTE — ED Triage Notes (Signed)
Patient c/o pain in his left thumb after a piece of metal cut his thumb.  Patient states that this happened on Sunday.

## 2018-06-21 ENCOUNTER — Emergency Department
Admission: EM | Admit: 2018-06-21 | Discharge: 2018-06-21 | Disposition: A | Discharge status: Home / Self Care | Payer: Medicaid Other | Attending: Emergency Medicine | Admitting: Emergency Medicine

## 2018-06-21 ENCOUNTER — Other Ambulatory Visit: Payer: Self-pay

## 2018-06-21 DIAGNOSIS — Z79899 Other long term (current) drug therapy: Secondary | ICD-10-CM | POA: Diagnosis not present

## 2018-06-21 DIAGNOSIS — Z87891 Personal history of nicotine dependence: Secondary | ICD-10-CM | POA: Diagnosis not present

## 2018-06-21 DIAGNOSIS — L03012 Cellulitis of left finger: Secondary | ICD-10-CM | POA: Diagnosis not present

## 2018-06-21 DIAGNOSIS — R2232 Localized swelling, mass and lump, left upper limb: Secondary | ICD-10-CM | POA: Diagnosis present

## 2018-06-21 LAB — CBC
HCT: 41.1 % (ref 39.0–52.0)
HEMOGLOBIN: 13 g/dL (ref 13.0–17.0)
MCH: 28.7 pg (ref 26.0–34.0)
MCHC: 31.6 g/dL (ref 30.0–36.0)
MCV: 90.7 fL (ref 80.0–100.0)
PLATELETS: 298 10*3/uL (ref 150–400)
RBC: 4.53 MIL/uL (ref 4.22–5.81)
RDW: 13.1 % (ref 11.5–15.5)
WBC: 9.9 10*3/uL (ref 4.0–10.5)
nRBC: 0 % (ref 0.0–0.2)

## 2018-06-21 LAB — BASIC METABOLIC PANEL
ANION GAP: 11 (ref 5–15)
BUN: 10 mg/dL (ref 6–20)
CHLORIDE: 102 mmol/L (ref 98–111)
CO2: 28 mmol/L (ref 22–32)
Calcium: 9.2 mg/dL (ref 8.9–10.3)
Creatinine, Ser: 1.07 mg/dL (ref 0.61–1.24)
GFR calc Af Amer: >60 mL/min (ref >60)
Glucose, Bld: 149 mg/dL — ABNORMAL HIGH (ref 70–99)
POTASSIUM: 3.6 mmol/L (ref 3.5–5.1)
SODIUM: 141 mmol/L (ref 135–145)

## 2018-06-21 MED ORDER — ACETAMINOPHEN 500 MG PO TABS
1000.0000 mg | ORAL_TABLET | Freq: Once | ORAL | Status: AC
  Administered 2018-06-21: 1000 mg via ORAL
  Filled 2018-06-21: qty 2

## 2018-06-21 MED ORDER — HYDROCODONE-ACETAMINOPHEN 5-325 MG PO TABS: 1.0000 | ORAL_TABLET | Freq: Four times a day (QID) | ORAL | 0 refills | Status: DC | PRN

## 2018-06-21 MED ORDER — CEPHALEXIN 500 MG PO CAPS: 500.0000 mg | ORAL_CAPSULE | Freq: Four times a day (QID) | ORAL | 0 refills | Status: AC

## 2018-06-21 MED ORDER — LIDOCAINE HCL (PF) 1 % IJ SOLN
2.0000 mL | Freq: Once | INTRAMUSCULAR | Status: DC
Start: 2018-06-21 — End: 2018-06-22
  Filled 2018-06-21: qty 5

## 2018-06-21 MED ORDER — MORPHINE SULFATE (PF) 4 MG/ML IV SOLN
4.0000 mg | Freq: Once | INTRAVENOUS | Status: AC
  Administered 2018-06-21: 4 mg via INTRAVENOUS
  Filled 2018-06-21: qty 1

## 2018-06-21 MED ORDER — VANCOMYCIN HCL 10 G IV SOLR
2000.0000 mg | Freq: Once | INTRAVENOUS | Status: AC
  Administered 2018-06-21: 2000 mg via INTRAVENOUS
  Filled 2018-06-21: qty 2000

## 2018-06-21 MED ORDER — OXYCODONE HCL 5 MG PO TABS
5.0000 mg | ORAL_TABLET | Freq: Once | ORAL | Status: AC
  Administered 2018-06-21: 5 mg via ORAL
  Filled 2018-06-21: qty 1

## 2018-06-21 MED ORDER — CEPHALEXIN 500 MG PO CAPS
500.0000 mg | ORAL_CAPSULE | Freq: Once | ORAL | Status: AC
  Administered 2018-06-21: 500 mg via ORAL
  Filled 2018-06-21: qty 1

## 2018-06-21 MED ORDER — IBUPROFEN 800 MG PO TABS
800.0000 mg | ORAL_TABLET | Freq: Once | ORAL | Status: AC
  Administered 2018-06-21: 800 mg via ORAL
  Filled 2018-06-21: qty 1

## 2018-06-21 NOTE — ED Provider Notes (Signed)
[facility] EMERGENCY DEPARTMENT Provider Note   CSN: [id number] Arrival date & time: 06/21/18  1740     History   Chief Complaint Chief Complaint  Patient presents with  . Hand Problem    HPI Donald Cooke is a 42 y.o. male presents to the emergency department for evaluation of left thumb swelling.  Patient states 6 days ago he suffered a superficial abrasion to the volar aspect of the left thumb along the proximal phalanx with a piece of metal.  Each day he developed increasing pain and swelling and was seen at the urgent care facility 1 day ago on 06/20/2018.  Urgent care facility performed x-rays showing no acute bony abnormality or foreign body.  Tetanus is up-to-date.  He was placed on Bactrim DS.  After 2 doses of Bactrim, patient has not seen much improvement and feels like there is been slight increase in swelling and redness to the thumb.  He comes in today for further evaluation.  Pain is moderate located along the long volar aspect of the proximal phalanx of left thumb.  He is able to fully extend and partially flex the thumb.  Flexion is limited slightly due to amount of swelling.  He denies any numbness or tingling.  Patient is not diabetic.  HPI  Past Medical History:  Diagnosis Date  . GERD (gastroesophageal reflux disease)     There are no active problems to display for this patient.   History reviewed. No pertinent surgical history.      Home Medications    Prior to Admission medications   Medication Sig Start Date End Date Taking? Authorizing Provider  cephALEXin (KEFLEX) 500 MG capsule Take 1 capsule (500 mg total) by mouth 4 (four) times daily for 10 days. 06/21/18 07/01/18  Evon Slack, PA-C  cyclobenzaprine (FLEXERIL) 10 MG tablet Take 1 tablet (10 mg total) by mouth 3 (three) times daily as needed for muscle spasms. 12/22/16   Payton Mccallum, MD  HYDROcodone-acetaminophen (NORCO) 5-325 MG tablet Take 1 tablet by mouth  every 6 (six) hours as needed for moderate pain. 06/21/18   Evon Slack, PA-C  pantoprazole (PROTONIX) 40 MG tablet Take 40 mg by mouth 2 (two) times daily.    [provider]  predniSONE (DELTASONE) 20 MG tablet Take 1 tablet (20 mg total) by mouth daily. 12/22/16   Payton Mccallum, MD  sulfamethoxazole-trimethoprim (BACTRIM DS,SEPTRA DS) 800-160 MG tablet Take 1 tablet by mouth 2 (two) times daily. 06/20/18   Payton Mccallum, MD    Family History Family History  Problem Relation Age of Onset  . Diabetes Mother     Social History Social History   Tobacco Use  . Smoking status: Former Games developer  . Smokeless tobacco: Current User    Types: Chew  Substance Use Topics  . Alcohol use: No  . Drug use: No     Allergies   Penicillins   Review of Systems Review of Systems  Constitutional: Negative for fever.  Respiratory: Negative for shortness of breath.   Cardiovascular: Negative for chest pain.  Gastrointestinal: Negative for nausea and vomiting.  Musculoskeletal: Positive for arthralgias and joint swelling.  Skin: Positive for wound. Negative for rash.  Neurological: Negative for numbness and headaches.     Physical Exam Updated Vital Signs BP 112/63 (BP Location: Left Arm)   Pulse 81   Temp 100.1 F (37.8 C) (Oral)   Resp 20   Ht 6\' 4"  (1.93 m)   Wt  129.2 kg   SpO2 100%   BMI 34.67 kg/m   Physical Exam  Constitutional: He is oriented to person, place, and time. He appears well-developed and well-nourished.  HENT:  Head: Normocephalic and atraumatic.  Eyes: Conjunctivae are normal.  Neck: Normal range of motion.  Cardiovascular: Normal rate.  Pulmonary/Chest: Effort normal. No respiratory distress.  Musculoskeletal:  Examination of the left hand and thumb shows patient has swelling and redness to the volar aspect of the proximal phalanx of the thumb.  There is slight swelling and induration.  Abrasion site is seen with no definite fluctuance.  He is  able to fully extend the thumb as well as flex the thumb 50% of his normal active flexion when compared to the right thumb.  Is nontender along the distal phalanx or thenar eminence.  There is no dorsal erythema.  Neurological: He is alert and oriented to person, place, and time.  Skin: Skin is warm. No rash noted.  Psychiatric: He has a normal mood and affect. His behavior is normal. Thought content normal.     ED Treatments / Results  Labs (all labs ordered are listed, but only abnormal results are displayed) Labs Reviewed  BASIC METABOLIC PANEL - Abnormal; Notable for the following components:      Result Value   Glucose, Bld 149 (*)    All other components within normal limits  AEROBIC/ANAEROBIC CULTURE (SURGICAL/DEEP WOUND)  CBC    EKG None  Radiology Dg Finger Thumb Left  Result Date: 06/20/2018 CLINICAL DATA:  Pain and swelling in the left thumb.  Laceration. EXAM: LEFT THUMB 2+V COMPARISON:  None. FINDINGS: No opaque foreign body. No soft tissue gas, fracture, or dislocation. Possible soft tissue swelling IMPRESSION: No fracture or opaque foreign body. Electronically Signed   By: Marnee Spring M.D.   On: 06/20/2018 17:35    Procedures .Marland KitchenIncision and Drainage Date/Time: 06/21/2018 8:13 PM Performed by: Evon Slack, PA-C Authorized by: Evon Slack, PA-C   Consent:    Consent obtained:  Verbal   Consent given by:  Patient   Alternatives discussed:  No treatment Location:    Type:  Abscess   Size:  1 x 1 cm   Location: Thumb left. Pre-procedure details:    Skin preparation:  Betadine Anesthesia (see MAR for exact dosages):    Anesthesia method:  None Procedure type:    Complexity:  Simple Procedure details:    Incision types:  Stab incision   Incision depth:  Dermal   Scalpel blade:  11   Wound treatment:  Wound left open   Packing materials:  None Post-procedure details:    Patient tolerance of procedure:  Tolerated well, no immediate  complications   (including critical care time)  Medications Ordered in ED Medications  lidocaine (PF) (XYLOCAINE) 1 % injection 2 mL (has no administration in time range)  morphine 4 MG/ML injection 4 mg (4 mg Intravenous Given 06/21/18 1824)  vancomycin (VANCOCIN) 2,000 mg in sodium chloride 0.9 % 500 mL IVPB (0 mg Intravenous Stopped 06/21/18 2302)  cephALEXin (KEFLEX) capsule 500 mg (500 mg Oral Given 06/21/18 2043)  acetaminophen (TYLENOL) tablet 1,000 mg (1,000 mg Oral Given 06/21/18 2108)  oxyCODONE (Oxy IR/ROXICODONE) immediate release tablet 5 mg (5 mg Oral Given 06/21/18 2238)     Initial Impression / Assessment and Plan / ED Course  I have reviewed the triage vital signs and the nursing notes.  Pertinent labs & imaging results that were available during my care  of the patient were reviewed by me and considered in my medical decision making (see chart for details).     [age] year old male with cellulitis to the left thumb.  Patient does not appear to have a infectious tenosynovitis of the left thumb at this time.  Patient is able to flex and fully extend the thumb.  Some concern for possible superficial fluctuant abscess along the volar aspect of proximal phalanx but after stab incision made, no purulent material expressed.  Patient afebrile, no leukocytosis.  Discussed admission to the hospital for observation and administration of IV antibiotics out of concern for possible worsening cellulitis with possible infectious tenosynovitis.  Patient refused any type of hospitalization.  Patient was agreeable to 1 dose of IV antibiotics along with adding cephalexin to Bactrim.  Patient understands signs and symptoms return to ED for.  He will follow-up with 2 days for recheck.  He will return sooner for any worsening symptoms.  Final Clinical Impressions(s) / ED Diagnoses   Final diagnoses:  Cellulitis of left thumb    ED Discharge Orders         Ordered    cephALEXin (KEFLEX) 500 MG  capsule  4 times daily     06/21/18 2202    HYDROcodone-acetaminophen (NORCO) 5-325 MG tablet  Every 6 hours PRN     06/21/18 2202           Ronnette JuniperGaines, Thomas C, PA-C 06/21/18 2303    Jene EveryKinner, Robert, MD 06/21/18 2308

## 2018-06-21 NOTE — ED Triage Notes (Signed)
Pt cut left thumb on piece of metal Sunday. Was prescribed bactrim yesterday at urgent care. States thumb is no better. Redness present, no obvious drainage or odor to site. tetanus shot utd

## 2018-06-21 NOTE — Discharge Instructions (Signed)
Please take Bactrim and cephalexin as prescribed.  Use Norco as needed for moderate to severe pain.  If any fevers, increasing pain, swelling, warmth or redness throughout the thumb return to the emergency department.

## 2018-06-21 NOTE — ED Notes (Signed)
Pt  Lacerated  Base of l  Thumb    6  Days  Seen last night at urgent care  placed  On antibiotics   Pain is worse  Today   Had  X rays

## 2018-08-24 ENCOUNTER — Other Ambulatory Visit: Payer: Self-pay

## 2018-08-24 ENCOUNTER — Emergency Department
Admission: EM | Admit: 2018-08-24 | Discharge: 2018-08-24 | Disposition: A | Discharge status: Home / Self Care | Payer: Medicaid Other | Attending: Emergency Medicine | Admitting: Emergency Medicine

## 2018-08-24 ENCOUNTER — Emergency Department: Payer: Medicaid Other

## 2018-08-24 DIAGNOSIS — Z87891 Personal history of nicotine dependence: Secondary | ICD-10-CM | POA: Diagnosis not present

## 2018-08-24 DIAGNOSIS — H8309 Labyrinthitis, unspecified ear: Secondary | ICD-10-CM | POA: Diagnosis not present

## 2018-08-24 DIAGNOSIS — R42 Dizziness and giddiness: Secondary | ICD-10-CM | POA: Diagnosis present

## 2018-08-24 DIAGNOSIS — Z79899 Other long term (current) drug therapy: Secondary | ICD-10-CM | POA: Diagnosis not present

## 2018-08-24 MED ORDER — PREDNISONE 10 MG (21) PO TBPK: ORAL_TABLET | ORAL | 0 refills | Status: DC

## 2018-08-24 MED ORDER — MECLIZINE HCL 25 MG PO TABS: 25.0000 mg | ORAL_TABLET | Freq: Two times a day (BID) | ORAL | 0 refills | Status: AC | PRN

## 2018-08-24 NOTE — ED Triage Notes (Signed)
Pt to the er for cold symptoms and dizziness. Pt says he has been taking tylenol x 1 week with last dose at 1430 today. Cough heard in triage.

## 2018-08-24 NOTE — ED Notes (Signed)
Patient transported to X-ray 

## 2018-08-24 NOTE — ED Notes (Signed)
Pt reports "deathly afraid" of needles, declining interventions

## 2018-08-24 NOTE — ED Provider Notes (Signed)
[facility] Emergency Department Provider Note  ____________________________________________  Time seen: Approximately 10:04 PM  I have reviewed the triage vital signs and the nursing notes.   HISTORY  Chief Complaint URI    HPI Donald Cooke is a 43 y.o. male presents to the emergency department with dizziness with standing for the past 2 days.  Patient had rhinorrhea, congestion and nonproductive cough for approximately 5 days prior to onset of dizziness.  Patient has had no nausea or vomiting.  He denies headache or changes in vision.  He denies chest pain, chest tightness, shortness of breath or abdominal pain. No alleviating measures have been attempted.    Past Medical History:  Diagnosis Date  . GERD (gastroesophageal reflux disease)     There are no active problems to display for this patient.   History reviewed. No pertinent surgical history.  Prior to Admission medications   Medication Sig Start Date End Date Taking? Authorizing Provider  cyclobenzaprine (FLEXERIL) 10 MG tablet Take 1 tablet (10 mg total) by mouth 3 (three) times daily as needed for muscle spasms. 12/22/16   Payton Mccallum, MD  HYDROcodone-acetaminophen (NORCO) 5-325 MG tablet Take 1 tablet by mouth every 6 (six) hours as needed for moderate pain. 06/21/18   Evon Slack, PA-C  meclizine (ANTIVERT) 25 MG tablet Take 1 tablet (25 mg total) by mouth 2 (two) times daily as needed for up to 7 days for dizziness. 08/24/18 08/31/18  Orvil Feil, PA-C  pantoprazole (PROTONIX) 40 MG tablet Take 40 mg by mouth 2 (two) times daily.    [provider]  predniSONE (STERAPRED UNI-PAK 21 TAB) 10 MG (21) TBPK tablet Take 6 tabs the the 1st day. Take 6 tabs the the 2nd day. Take 5 tabs the the 3rd day. Take 5 tabs the 4th day. Take 4 tabs the the 5th day.Take 4 tabs the the 6th day.Take 3 tabs the 7th day.Take 3 tabs the 8th day. Take 2 tabs the 9th day. Take 2 tabs the 10th day.  Take 1 tab the 11th day. Take 1 tab the 12th day. 08/24/18   Orvil Feil, PA-C  sulfamethoxazole-trimethoprim (BACTRIM DS,SEPTRA DS) 800-160 MG tablet Take 1 tablet by mouth 2 (two) times daily. 06/20/18   Payton Mccallum, MD    Allergies Penicillins  Family History  Problem Relation Age of Onset  . Diabetes Mother     Social History Social History   Tobacco Use  . Smoking status: Former Games developer  . Smokeless tobacco: Current User    Types: Chew  Substance Use Topics  . Alcohol use: No  . Drug use: No     Review of Systems  Constitutional: Patient has dizziness.  Eyes: No visual changes. No discharge ENT: Patient has had nasal congestion. Cardiovascular: no chest pain. Respiratory: Patient has sporadic cough. No SOB. Gastrointestinal: No abdominal pain.  No nausea, no vomiting.  No diarrhea.  No constipation. Genitourinary: Negative for dysuria. No hematuria Musculoskeletal: Negative for musculoskeletal pain. Skin: Negative for rash, abrasions, lacerations, ecchymosis. Neurological: Negative for headaches, focal weakness or numbness.   ____________________________________________   PHYSICAL EXAM:  VITAL SIGNS: ED Triage Vitals  Enc Vitals Group     BP 08/24/18 2037 119/75     Pulse Rate 08/24/18 2037 75     Resp 08/24/18 2037 18     Temp 08/24/18 2037 98.8 F (37.1 C)     Temp Source 08/24/18 2037 Oral     SpO2 08/24/18 2037  100 %     Weight 08/24/18 2038 280 lb (127 kg)     Height 08/24/18 2038 6\' 4"  (1.93 m)     Head Circumference --      Peak Flow --      Pain Score 08/24/18 2038 0     Pain Loc --      Pain Edu? --      Excl. in GC? --      Constitutional: Alert and oriented. Patient is lying supine. Eyes: Conjunctivae are normal. PERRL. EOMI. Head: Atraumatic. ENT:      Ears: Tympanic membranes are mildly injected with mild effusion bilaterally.       Nose: No congestion/rhinnorhea.      Mouth/Throat: Mucous membranes are moist. Posterior  pharynx is mildly erythematous.  Hematological/Lymphatic/Immunilogical: No cervical lymphadenopathy.  Cardiovascular: Normal rate, regular rhythm. Normal S1 and S2.  Good peripheral circulation. Respiratory: Normal respiratory effort without tachypnea or retractions. Lungs CTAB. Good air entry to the bases with no decreased or absent breath sounds. Gastrointestinal: Bowel sounds 4 quadrants. Soft and nontender to palpation. No guarding or rigidity. No palpable masses. No distention. No CVA tenderness. Musculoskeletal: Full range of motion to all extremities. No gross deformities appreciated. Neurologic:  Normal speech and language. No gross focal neurologic deficits are appreciated.  Skin:  Skin is warm, dry and intact. No rash noted. Psychiatric: Mood and affect are normal. Speech and behavior are normal. Patient exhibits appropriate insight and judgement.    ____________________________________________   LABS (all labs ordered are listed, but only abnormal results are displayed)  Labs Reviewed - No data to display ____________________________________________  EKG  Normal sinus rhythm with narrow QRS.  No ST segment elevation or T wave abnormalities.  ____________________________________________  RADIOLOGY I personally viewed and evaluated these images as part of my medical decision making, as well as reviewing the written report by the radiologist.  Dg Chest 2 View  Result Date: 08/24/2018 CLINICAL DATA:  Cough for 1 week EXAM: CHEST - 2 VIEW COMPARISON:  03/08/2009 FINDINGS: Central airways thickening consistent with bronchitis. Streaky atelectasis at the left base. No pleural effusion. Normal heart size. No pneumothorax. IMPRESSION: Central airways thickening which may be secondary to reactive airways or acute bronchitis/viral process. No focal pneumonia. Electronically Signed   By: Jasmine PangKim  Fujinaga M.D.   On: 08/24/2018 21:27     ____________________________________________    PROCEDURES  Procedure(s) performed:    Procedures    Medications - No data to display   ____________________________________________   INITIAL IMPRESSION / ASSESSMENT AND PLAN / ED COURSE  Pertinent labs & imaging results that were available during my care of the patient were reviewed by me and considered in my medical decision making (see chart for details).  Review of the [place] CSRS was performed in accordance of the [facility] prior to dispensing any controlled drugs.      Assessment and Plan: Labyrinthitis:  Patient presents to the emergency department with 2 days of dizziness when standing after having viral URI-like symptoms.  Labyrinthitis is likely at this time.  I ordered basic labs in emergency department.  Patient adamantly declined blood work in the emergency department as he reports fear of needles.  Chest x-ray reveals no acute abnormality.  EKG reveals normal sinus rhythm without ST segment elevation.  Patient was discharged with prednisone and meclizine.  Strict return precautions were given to return to the emergency department for new or worsening symptoms.  All patient questions were answered.  ____________________________________________  FINAL CLINICAL IMPRESSION(S) / ED DIAGNOSES  Final diagnoses:  Labyrinthitis, unspecified laterality      NEW MEDICATIONS STARTED DURING THIS VISIT:  ED Discharge Orders         Ordered    predniSONE (STERAPRED UNI-PAK 21 TAB) 10 MG (21) TBPK tablet     08/24/18 2223    meclizine (ANTIVERT) 25 MG tablet  2 times daily PRN     08/24/18 2223              This chart was dictated using voice recognition software/Dragon. Despite best efforts to proofread, errors can occur which can change the meaning. Any change was purely unintentional.    Gasper Lloyd 08/24/18 2233    Minna Antis, MD 08/24/18 2306

## 2018-08-24 NOTE — ED Provider Notes (Signed)
EKG read interpreted by me shows normal sinus rhythm rate of 62 normal axis nonspecific ST-T wave changes.   Donald Cooke, Donald F, MD 08/24/18 2226

## 2018-08-24 NOTE — ED Notes (Signed)
Pt c/o feeling drunk but not being drunk, cough and lightheadedness for the last 5 days, took alka-seltzer and tylenol without effect  With friend

## 2019-12-29 ENCOUNTER — Other Ambulatory Visit: Payer: Self-pay

## 2019-12-29 ENCOUNTER — Encounter: Payer: Self-pay | Admitting: Emergency Medicine

## 2019-12-29 ENCOUNTER — Ambulatory Visit
Admission: EM | Admit: 2019-12-29 | Discharge: 2019-12-29 | Disposition: A | Discharge status: Home / Self Care | Payer: Medicaid Other | Attending: Family Medicine | Admitting: Family Medicine

## 2019-12-29 DIAGNOSIS — M5441 Lumbago with sciatica, right side: Secondary | ICD-10-CM

## 2019-12-29 DIAGNOSIS — M5442 Lumbago with sciatica, left side: Secondary | ICD-10-CM | POA: Diagnosis not present

## 2019-12-29 MED ORDER — TIZANIDINE HCL 4 MG PO TABS: 4.0000 mg | ORAL_TABLET | Freq: Four times a day (QID) | ORAL | 0 refills | Status: AC | PRN

## 2019-12-29 MED ORDER — PREDNISONE 10 MG PO TABS: ORAL_TABLET | ORAL | 0 refills | Status: DC

## 2019-12-29 NOTE — Discharge Instructions (Signed)
Rest.  Medications as prescribed.  Take care  Dr. Cormick Moss  

## 2019-12-29 NOTE — ED Triage Notes (Signed)
Patient c/o bilateral lower back pain that radiates into his leg down to his feet x 3 days. Denies injury.

## 2019-12-29 NOTE — ED Provider Notes (Signed)
[facility]    CSN: [id number] Arrival date & time: 12/29/19  1146   History   Chief Complaint Chief Complaint  Patient presents with  . Back Pain  . Hip Pain   HPI  44 year old male presents with the above complaints.  3-day history of bilateral low back pain which radiates to the hips and down the leg.  He reports tingling sensation down the legs.  No fall, trauma, injury.  He does note recent increase in physical activity.  He has taken ibuprofen without relief.  No reports of saddle anesthesia or incontinence.  No other associated symptoms.  No other complaints.  Past Medical History:  Diagnosis Date  . GERD (gastroesophageal reflux disease)    Home Medications    Prior to Admission medications   Medication Sig Start Date End Date Taking? Authorizing Provider  pantoprazole (PROTONIX) 40 MG tablet Take 40 mg by mouth 2 (two) times daily.   Yes [provider]  predniSONE (DELTASONE) 10 MG tablet 50 mg daily x 2 days, then 40 mg daily x 2 days, then 30 mg daily x 2 days, then 20 mg daily x 2 days, then 10 mg daily x 2 days. 12/29/19   Tommie Sams, DO  tiZANidine (ZANAFLEX) 4 MG tablet Take 1 tablet (4 mg total) by mouth every 6 (six) hours as needed for muscle spasms. 12/29/19   Tommie Sams, DO    Family History Family History  Problem Relation Age of Onset  . Diabetes Mother     Social History Social History   Tobacco Use  . Smoking status: Former Games developer  . Smokeless tobacco: Current User    Types: Chew  Substance Use Topics  . Alcohol use: No  . Drug use: No     Allergies   Penicillins   Review of Systems Review of Systems  Musculoskeletal: Positive for back pain.   Physical Exam Triage Vital Signs ED Triage Vitals [12/29/19 1224]  Enc Vitals Group     BP 139/79     Pulse Rate 66     Resp 18     Temp 98.2 F (36.8 C)     Temp Source Oral     SpO2 99 %     Weight 300 lb (136.1 kg)     Height 6\' 4"  (1.93 m)     Head  Circumference      Peak Flow      Pain Score 8     Pain Loc      Pain Edu?      Excl. in GC?    Updated Vital Signs BP 139/79 (BP Location: Right Arm)   Pulse 66   Temp 98.2 F (36.8 C) (Oral)   Resp 18   Ht 6\' 4"  (1.93 m)   Wt 136.1 kg   SpO2 99%   BMI 36.52 kg/m   Visual Acuity Right Eye Distance:   Left Eye Distance:   Bilateral Distance:    Right Eye Near:   Left Eye Near:    Bilateral Near:     Physical Exam Vitals and nursing note reviewed.  Constitutional:      General: He is not in acute distress.    Appearance: Normal appearance. He is not ill-appearing.  HENT:     Head: Normocephalic and atraumatic.  Cardiovascular:     Rate and Rhythm: Normal rate and regular rhythm.  Pulmonary:     Effort: Pulmonary effort is normal.  Breath sounds: Normal breath sounds.  Musculoskeletal:     Comments: Lumbar spine - Bilateral paraspinal tenderness palpation  Neurological:     Mental Status: He is alert.  Psychiatric:        Mood and Affect: Mood normal.        Behavior: Behavior normal.    UC Treatments / Results  Labs (all labs ordered are listed, but only abnormal results are displayed) Labs Reviewed - No data to display  EKG   Radiology No results found.  Procedures Procedures (including critical care time)  Medications Ordered in UC Medications - No data to display  Initial Impression / Assessment and Plan / UC Course  I have reviewed the triage vital signs and the nursing notes.  Pertinent labs & imaging results that were available during my care of the patient were reviewed by me and considered in my medical decision making (see chart for details).    44 year old male presents with acute low back pain.  Treating with prednisone and Zanaflex.  Final Clinical Impressions(s) / UC Diagnoses   Final diagnoses:  Acute bilateral low back pain with bilateral sciatica     Discharge Instructions     Rest.  Medications as  prescribed.  Take care  Dr. Lacinda Axon    ED Prescriptions    Medication Sig Dispense Auth. Provider   predniSONE (DELTASONE) 10 MG tablet 50 mg daily x 2 days, then 40 mg daily x 2 days, then 30 mg daily x 2 days, then 20 mg daily x 2 days, then 10 mg daily x 2 days. 30 tablet Tyvion Edmondson G, DO   tiZANidine (ZANAFLEX) 4 MG tablet Take 1 tablet (4 mg total) by mouth every 6 (six) hours as needed for muscle spasms. 30 tablet Coral Spikes, DO     PDMP not reviewed this encounter.   Coral Spikes, [place] 12/29/19 1412

## 2020-01-28 DIAGNOSIS — Z419 Encounter for procedure for purposes other than remedying health state, unspecified: Secondary | ICD-10-CM | POA: Diagnosis not present

## 2020-02-28 DIAGNOSIS — Z419 Encounter for procedure for purposes other than remedying health state, unspecified: Secondary | ICD-10-CM | POA: Diagnosis not present

## 2020-03-30 DIAGNOSIS — Z419 Encounter for procedure for purposes other than remedying health state, unspecified: Secondary | ICD-10-CM | POA: Diagnosis not present

## 2020-04-29 DIAGNOSIS — Z419 Encounter for procedure for purposes other than remedying health state, unspecified: Secondary | ICD-10-CM | POA: Diagnosis not present

## 2020-05-30 DIAGNOSIS — Z419 Encounter for procedure for purposes other than remedying health state, unspecified: Secondary | ICD-10-CM | POA: Diagnosis not present

## 2020-06-29 DIAGNOSIS — Z419 Encounter for procedure for purposes other than remedying health state, unspecified: Secondary | ICD-10-CM | POA: Diagnosis not present

## 2020-07-30 DIAGNOSIS — Z419 Encounter for procedure for purposes other than remedying health state, unspecified: Secondary | ICD-10-CM | POA: Diagnosis not present

## 2020-08-30 DIAGNOSIS — Z419 Encounter for procedure for purposes other than remedying health state, unspecified: Secondary | ICD-10-CM | POA: Diagnosis not present

## 2020-09-27 DIAGNOSIS — Z419 Encounter for procedure for purposes other than remedying health state, unspecified: Secondary | ICD-10-CM | POA: Diagnosis not present

## 2020-10-28 DIAGNOSIS — Z419 Encounter for procedure for purposes other than remedying health state, unspecified: Secondary | ICD-10-CM | POA: Diagnosis not present

## 2020-11-27 DIAGNOSIS — Z419 Encounter for procedure for purposes other than remedying health state, unspecified: Secondary | ICD-10-CM | POA: Diagnosis not present

## 2020-12-28 DIAGNOSIS — Z419 Encounter for procedure for purposes other than remedying health state, unspecified: Secondary | ICD-10-CM | POA: Diagnosis not present

## 2021-01-27 DIAGNOSIS — Z419 Encounter for procedure for purposes other than remedying health state, unspecified: Secondary | ICD-10-CM | POA: Diagnosis not present

## 2021-02-27 DIAGNOSIS — Z419 Encounter for procedure for purposes other than remedying health state, unspecified: Secondary | ICD-10-CM | POA: Diagnosis not present

## 2021-03-30 DIAGNOSIS — Z419 Encounter for procedure for purposes other than remedying health state, unspecified: Secondary | ICD-10-CM | POA: Diagnosis not present

## 2021-04-29 DIAGNOSIS — Z419 Encounter for procedure for purposes other than remedying health state, unspecified: Secondary | ICD-10-CM | POA: Diagnosis not present

## 2021-05-30 DIAGNOSIS — Z419 Encounter for procedure for purposes other than remedying health state, unspecified: Secondary | ICD-10-CM | POA: Diagnosis not present

## 2021-06-29 DIAGNOSIS — Z419 Encounter for procedure for purposes other than remedying health state, unspecified: Secondary | ICD-10-CM | POA: Diagnosis not present

## 2021-07-30 DIAGNOSIS — Z419 Encounter for procedure for purposes other than remedying health state, unspecified: Secondary | ICD-10-CM | POA: Diagnosis not present

## 2021-08-30 DIAGNOSIS — Z419 Encounter for procedure for purposes other than remedying health state, unspecified: Secondary | ICD-10-CM | POA: Diagnosis not present

## 2021-09-27 DIAGNOSIS — Z419 Encounter for procedure for purposes other than remedying health state, unspecified: Secondary | ICD-10-CM | POA: Diagnosis not present

## 2021-10-28 DIAGNOSIS — Z419 Encounter for procedure for purposes other than remedying health state, unspecified: Secondary | ICD-10-CM | POA: Diagnosis not present

## 2021-11-27 DIAGNOSIS — Z419 Encounter for procedure for purposes other than remedying health state, unspecified: Secondary | ICD-10-CM | POA: Diagnosis not present

## 2022-04-29 DIAGNOSIS — Z419 Encounter for procedure for purposes other than remedying health state, unspecified: Secondary | ICD-10-CM | POA: Diagnosis not present

## 2022-05-30 DIAGNOSIS — Z419 Encounter for procedure for purposes other than remedying health state, unspecified: Secondary | ICD-10-CM | POA: Diagnosis not present

## 2022-06-29 DIAGNOSIS — Z419 Encounter for procedure for purposes other than remedying health state, unspecified: Secondary | ICD-10-CM | POA: Diagnosis not present

## 2022-07-30 DIAGNOSIS — Z419 Encounter for procedure for purposes other than remedying health state, unspecified: Secondary | ICD-10-CM | POA: Diagnosis not present

## 2022-08-30 DIAGNOSIS — Z419 Encounter for procedure for purposes other than remedying health state, unspecified: Secondary | ICD-10-CM | POA: Diagnosis not present

## 2022-09-28 DIAGNOSIS — Z419 Encounter for procedure for purposes other than remedying health state, unspecified: Secondary | ICD-10-CM | POA: Diagnosis not present

## 2022-10-29 DIAGNOSIS — Z419 Encounter for procedure for purposes other than remedying health state, unspecified: Secondary | ICD-10-CM | POA: Diagnosis not present

## 2022-11-28 DIAGNOSIS — Z419 Encounter for procedure for purposes other than remedying health state, unspecified: Secondary | ICD-10-CM | POA: Diagnosis not present

## 2022-12-29 DIAGNOSIS — Z419 Encounter for procedure for purposes other than remedying health state, unspecified: Secondary | ICD-10-CM | POA: Diagnosis not present

## 2023-01-28 DIAGNOSIS — Z419 Encounter for procedure for purposes other than remedying health state, unspecified: Secondary | ICD-10-CM | POA: Diagnosis not present

## 2023-02-28 DIAGNOSIS — Z419 Encounter for procedure for purposes other than remedying health state, unspecified: Secondary | ICD-10-CM | POA: Diagnosis not present

## 2023-03-21 DIAGNOSIS — K130 Diseases of lips: Secondary | ICD-10-CM | POA: Diagnosis not present

## 2023-03-21 DIAGNOSIS — T781XXA Other adverse food reactions, not elsewhere classified, initial encounter: Secondary | ICD-10-CM | POA: Diagnosis not present

## 2023-03-21 DIAGNOSIS — T7840XA Allergy, unspecified, initial encounter: Secondary | ICD-10-CM | POA: Diagnosis not present

## 2023-03-31 DIAGNOSIS — Z419 Encounter for procedure for purposes other than remedying health state, unspecified: Secondary | ICD-10-CM | POA: Diagnosis not present

## 2023-04-07 ENCOUNTER — Ambulatory Visit
Admission: EM | Admit: 2023-04-07 | Discharge: 2023-04-07 | Disposition: A | Discharge status: Home / Self Care | Payer: Medicaid Other | Attending: Family Medicine | Admitting: Family Medicine

## 2023-04-07 ENCOUNTER — Encounter: Payer: Self-pay | Admitting: Emergency Medicine

## 2023-04-07 DIAGNOSIS — K068 Other specified disorders of gingiva and edentulous alveolar ridge: Secondary | ICD-10-CM | POA: Diagnosis not present

## 2023-04-07 DIAGNOSIS — K047 Periapical abscess without sinus: Secondary | ICD-10-CM

## 2023-04-07 MED ORDER — CLINDAMYCIN HCL 300 MG PO CAPS: 300.0000 mg | ORAL_CAPSULE | Freq: Three times a day (TID) | ORAL | 0 refills | Status: AC

## 2023-04-07 MED ORDER — NAPROXEN 500 MG PO TABS: 500.0000 mg | ORAL_TABLET | Freq: Two times a day (BID) | ORAL | 0 refills | Status: AC

## 2023-04-07 NOTE — ED Triage Notes (Signed)
Patient states that he has a mouth sore on the side of his lower lip since 03/19/23.

## 2023-04-07 NOTE — ED Provider Notes (Signed)
[facility]    CSN: [id number] Arrival date & time: 04/07/23  4098      History   Chief Complaint Chief Complaint  Patient presents with   Mouth Lesions    HPI Yen Morris is a 47 y.o. male.   HPI  Morris presents for mouth lesion that is getting worse with pain. Symptoms started on 03/19/23.  Has dental pain with front upper gumline swelling.  No fever, cold symptoms. Taking Tylenol and ibuprofen.  No recent antibiotics. No jaw swelling or difficulty swallowing.  Has not been to the dentist in a while.       Past Medical History:  Diagnosis Date   GERD (gastroesophageal reflux disease)     There are no problems to display for this patient.   History reviewed. No pertinent surgical history.     Home Medications    Prior to Admission medications   Medication Sig Start Date End Date Taking? Authorizing Provider  clindamycin (CLEOCIN) 300 MG capsule Take 1 capsule (300 mg total) by mouth 3 (three) times daily for 7 days. 04/07/23 04/14/23 Yes Carlisa Eble, DO  naproxen (NAPROSYN) 500 MG tablet Take 1 tablet (500 mg total) by mouth 2 (two) times daily with a meal. 04/07/23  Yes Ertha Nabor, DO  pantoprazole (PROTONIX) 40 MG tablet Take 40 mg by mouth 2 (two) times daily.    [provider]  tiZANidine (ZANAFLEX) 4 MG tablet Take 1 tablet (4 mg total) by mouth every 6 (six) hours as needed for muscle spasms. 12/29/19   Tommie Sams, DO    Family History Family History  Problem Relation Age of Onset   Diabetes Mother     Social History Social History   Tobacco Use   Smoking status: Former   Smokeless tobacco: Current    Types: Engineer, drilling   Vaping status: Never Used  Substance Use Topics   Alcohol use: No   Drug use: No     Allergies   Penicillins   Review of Systems Review of Systems : negative unless otherwise stated in HPI.      Physical Exam Triage Vital Signs ED Triage Vitals  Encounter Vitals Group      BP 04/07/23 0941 138/84     Systolic BP Percentile --      Diastolic BP Percentile --      Pulse Rate 04/07/23 0941 90     Resp 04/07/23 0941 16     Temp 04/07/23 0941 98.4 F (36.9 C)     Temp Source 04/07/23 0941 Oral     SpO2 04/07/23 0941 96 %     Weight 04/07/23 0940 (!) 300 lb 0.7 oz (136.1 kg)     Height 04/07/23 0940 6\' 4"  (1.93 m)     Head Circumference --      Peak Flow --      Pain Score 04/07/23 0939 8     Pain Loc --      Pain Education --      Exclude from Growth Chart --    No data found.  Updated Vital Signs BP 138/84 (BP Location: Right Arm)   Pulse 90   Temp 98.4 F (36.9 C) (Oral)   Resp 16   Ht 6\' 4"  (1.93 m)   Wt (!) 136.1 kg   SpO2 96%   BMI 36.52 kg/m   Visual Acuity Right Eye Distance:   Left Eye Distance:   Bilateral Distance:  Right Eye Near:   Left Eye Near:    Bilateral Near:     Physical Exam  GEN:     alert, uncomfortable appearing male in no distress    HENT:  mucus membranes moist, oropharyngeal without lesions exudates or erythema, nasal discharge, poor dentition, multiple crack offs and dental caries with plaque buildup visible, left upper incisor tender to percussion with anterior gumline abscess with pustule, no trismus, no secretion pooling,, no visible swelling of the floor the mouth,  normal jaw movement without difficulty EYES:    no scleral injection or discharge NECK:  normal ROM, no lymphadenopathy, no meningismus   RESP:  no increased work of breathing CVS:   regular rate  Skin:   warm and dry, no rash or skin changes of on external jaw    UC Treatments / Results  Labs (all labs ordered are listed, but only abnormal results are displayed) Labs Reviewed - No data to display  EKG   Radiology No results found.  Procedures Procedures (including critical care time)  Medications Ordered in UC Medications - No data to display  Initial Impression / Assessment and Plan / UC Course  I have reviewed the triage  vital signs and the nursing notes.  Pertinent labs & imaging results that were available during my care of the patient were reviewed by me and considered in my medical decision making (see chart for details).     Pt is a 47 y.o. male who presents for 2 weeks of dental pain with new onset sore inside of his mouth. Marice is afebrile here w. Satting well on room air. Overall pt is well appearing, well hydrated, without respiratory distress.  Dental exam showing poor dentition and left upper incisor concerning for dental abscess.  Offered needlestick I&D but he declines.  Prescribed Naprosyn and clindamycin.  Tylenol as needed for discomfort.  Dental resource handout provided. - Gargle with salt water several times a day - Handout provided  - Establish care with a dentist - Discussed  ED precautions, understanding voiced.   Discussed MDM, treatment plan and plan for follow-up with patient who agrees with plan.   Final Clinical Impressions(s) / UC Diagnoses   Final diagnoses:  Dental abscess  Pain in gums     Discharge Instructions      At this time there is no abscess to be drained. You were prescribed an antibiotic. Please take this exactly as directed and do not stop taking it until the entire course of medicine is finished, even if you begin to feel better before finishing the course.   Schedule an appointment with your dentist or call local dentists to see if they take your insurance or can do a payment payment plan.  Teacher, music school of dentistry are options. Consider [facility] prior to your dental appointment.   Consider the [facility] dental school  free clinic operates from 6-9 pm on select Wednesdays:  [facility] Ground Floor, [place] [address] [place], [place] [address] Parking Location: [facility]           ED Prescriptions     Medication Sig Dispense Auth. Provider   naproxen (NAPROSYN) 500 MG tablet Take 1 tablet (500 mg total) by mouth  2 (two) times daily with a meal. 30 tablet Taejon Irani, DO   clindamycin (CLEOCIN) 300 MG capsule Take 1 capsule (300 mg total) by mouth 3 (three) times daily for 7 days. 21 capsule Katha Cabal,  DO      PDMP not reviewed this encounter.   Katha Cabal, DO 04/13/23 2347

## 2023-04-07 NOTE — Discharge Instructions (Addendum)
At this time there is no abscess to be drained. You were prescribed an antibiotic. Please take this exactly as directed and do not stop taking it until the entire course of medicine is finished, even if you begin to feel better before finishing the course.   Schedule an appointment with your dentist or call local dentists to see if they take your insurance or can do a payment payment plan.  Teacher, music school of dentistry are options. Consider [facility] prior to your dental appointment.   Consider the [facility] dental school  free clinic operates from 6-9 pm on select Wednesdays:  [facility] [employer], [place] [address] [place], [place] [address] Parking Location: [facility]

## 2023-05-31 DIAGNOSIS — Z419 Encounter for procedure for purposes other than remedying health state, unspecified: Secondary | ICD-10-CM | POA: Diagnosis not present

## 2023-06-30 DIAGNOSIS — Z419 Encounter for procedure for purposes other than remedying health state, unspecified: Secondary | ICD-10-CM | POA: Diagnosis not present

## 2023-07-31 DIAGNOSIS — Z419 Encounter for procedure for purposes other than remedying health state, unspecified: Secondary | ICD-10-CM | POA: Diagnosis not present

## 2023-08-31 DIAGNOSIS — Z419 Encounter for procedure for purposes other than remedying health state, unspecified: Secondary | ICD-10-CM | POA: Diagnosis not present

## 2023-09-28 DIAGNOSIS — Z419 Encounter for procedure for purposes other than remedying health state, unspecified: Secondary | ICD-10-CM | POA: Diagnosis not present

## 2023-11-09 DIAGNOSIS — Z419 Encounter for procedure for purposes other than remedying health state, unspecified: Secondary | ICD-10-CM | POA: Diagnosis not present

## 2023-12-09 DIAGNOSIS — Z419 Encounter for procedure for purposes other than remedying health state, unspecified: Secondary | ICD-10-CM | POA: Diagnosis not present

## 2024-01-09 DIAGNOSIS — Z419 Encounter for procedure for purposes other than remedying health state, unspecified: Secondary | ICD-10-CM | POA: Diagnosis not present

## 2024-02-08 DIAGNOSIS — Z419 Encounter for procedure for purposes other than remedying health state, unspecified: Secondary | ICD-10-CM | POA: Diagnosis not present

## 2024-03-10 DIAGNOSIS — Z419 Encounter for procedure for purposes other than remedying health state, unspecified: Secondary | ICD-10-CM | POA: Diagnosis not present

## 2024-04-10 DIAGNOSIS — Z419 Encounter for procedure for purposes other than remedying health state, unspecified: Secondary | ICD-10-CM | POA: Diagnosis not present
